# Patient Record
Sex: Male | Born: 1965
Health system: Southern US, Community
[De-identification: ages and names within clinical notes are randomized; demographics above are authoritative.]

## PROBLEM LIST (undated history)

## (undated) DIAGNOSIS — E7800 Pure hypercholesterolemia, unspecified: Secondary | ICD-10-CM

## (undated) DIAGNOSIS — E0590 Thyrotoxicosis, unspecified without thyrotoxic crisis or storm: Secondary | ICD-10-CM

## (undated) DIAGNOSIS — K219 Gastro-esophageal reflux disease without esophagitis: Secondary | ICD-10-CM

## (undated) DIAGNOSIS — I10 Essential (primary) hypertension: Secondary | ICD-10-CM

## (undated) HISTORY — DX: Thyrotoxicosis, unspecified without thyrotoxic crisis or storm: E05.90

## (undated) HISTORY — DX: Gastro-esophageal reflux disease without esophagitis: K21.9

---

## 2002-02-04 ENCOUNTER — Emergency Department (HOSPITAL_COMMUNITY): Admission: EM | Admit: 2002-02-04 | Discharge: 2002-02-04 | Payer: Self-pay | Admitting: Emergency Medicine

## 2006-10-01 ENCOUNTER — Encounter: Admission: RE | Admit: 2006-10-01 | Discharge: 2006-10-01 | Payer: Self-pay | Admitting: Occupational Medicine

## 2009-05-02 ENCOUNTER — Emergency Department (HOSPITAL_COMMUNITY): Admission: EM | Admit: 2009-05-02 | Discharge: 2009-05-02 | Payer: Self-pay | Admitting: Emergency Medicine

## 2009-09-28 ENCOUNTER — Emergency Department (HOSPITAL_COMMUNITY): Admission: EM | Admit: 2009-09-28 | Discharge: 2009-09-28 | Payer: Self-pay | Admitting: Emergency Medicine

## 2010-04-30 ENCOUNTER — Ambulatory Visit (HOSPITAL_COMMUNITY)
Admission: RE | Admit: 2010-04-30 | Discharge: 2010-04-30 | Disposition: A | Discharge status: Home / Self Care | Payer: Medicaid Other | Source: Ambulatory Visit | Attending: "Endocrinology | Admitting: "Endocrinology

## 2010-04-30 ENCOUNTER — Other Ambulatory Visit (HOSPITAL_COMMUNITY): Payer: Self-pay | Admitting: *Deleted

## 2010-04-30 DIAGNOSIS — J40 Bronchitis, not specified as acute or chronic: Secondary | ICD-10-CM | POA: Insufficient documentation

## 2010-06-12 LAB — GLUCOSE, CAPILLARY: Glucose-Capillary: 142 mg/dL — ABNORMAL HIGH (ref 70–99)

## 2010-07-14 ENCOUNTER — Other Ambulatory Visit (HOSPITAL_COMMUNITY): Payer: Self-pay | Admitting: "Endocrinology

## 2010-07-14 DIAGNOSIS — J40 Bronchitis, not specified as acute or chronic: Secondary | ICD-10-CM

## 2012-03-14 ENCOUNTER — Other Ambulatory Visit (HOSPITAL_COMMUNITY): Payer: Self-pay | Admitting: Internal Medicine

## 2012-03-14 DIAGNOSIS — E0590 Thyrotoxicosis, unspecified without thyrotoxic crisis or storm: Secondary | ICD-10-CM

## 2012-03-17 ENCOUNTER — Encounter (HOSPITAL_COMMUNITY): Payer: Self-pay

## 2012-03-17 ENCOUNTER — Encounter (HOSPITAL_COMMUNITY)
Admission: RE | Admit: 2012-03-17 | Discharge: 2012-03-17 | Disposition: A | Discharge status: Home / Self Care | Payer: Medicaid Other | Source: Ambulatory Visit | Attending: Internal Medicine | Admitting: Internal Medicine

## 2012-03-17 DIAGNOSIS — E0590 Thyrotoxicosis, unspecified without thyrotoxic crisis or storm: Secondary | ICD-10-CM | POA: Insufficient documentation

## 2012-03-17 HISTORY — DX: Essential (primary) hypertension: I10

## 2012-03-17 MED ORDER — SODIUM IODIDE I 131 CAPSULE
10.0000 | Freq: Once | INTRAVENOUS | Status: AC | PRN
  Administered 2012-03-17: 12 via ORAL

## 2012-03-18 ENCOUNTER — Encounter (HOSPITAL_COMMUNITY)
Admission: RE | Admit: 2012-03-18 | Discharge: 2012-03-18 | Disposition: A | Discharge status: Home / Self Care | Payer: Medicaid Other | Source: Ambulatory Visit | Attending: Internal Medicine | Admitting: Internal Medicine

## 2012-03-18 MED ORDER — SODIUM PERTECHNETATE TC 99M INJECTION
10.0000 | Freq: Once | INTRAVENOUS | Status: AC | PRN
  Administered 2012-03-18: 10 via INTRAVENOUS

## 2012-03-21 ENCOUNTER — Other Ambulatory Visit (HOSPITAL_COMMUNITY): Payer: Self-pay | Admitting: "Endocrinology

## 2012-03-21 DIAGNOSIS — E0500 Thyrotoxicosis with diffuse goiter without thyrotoxic crisis or storm: Secondary | ICD-10-CM

## 2012-03-24 ENCOUNTER — Encounter (HOSPITAL_COMMUNITY)
Admission: RE | Admit: 2012-03-24 | Discharge: 2012-03-24 | Disposition: A | Discharge status: Home / Self Care | Payer: Medicaid Other | Source: Ambulatory Visit | Attending: "Endocrinology | Admitting: "Endocrinology

## 2012-03-24 ENCOUNTER — Encounter (HOSPITAL_COMMUNITY): Payer: Self-pay

## 2012-03-24 DIAGNOSIS — E0500 Thyrotoxicosis with diffuse goiter without thyrotoxic crisis or storm: Secondary | ICD-10-CM | POA: Insufficient documentation

## 2012-03-24 MED ORDER — SODIUM IODIDE I 131 CAPSULE
15.0000 | Freq: Once | INTRAVENOUS | Status: AC | PRN
  Administered 2012-03-24: 15 via ORAL

## 2013-11-15 ENCOUNTER — Other Ambulatory Visit (HOSPITAL_COMMUNITY): Payer: Self-pay | Admitting: Oral Surgery

## 2013-11-15 DIAGNOSIS — D164 Benign neoplasm of bones of skull and face: Secondary | ICD-10-CM

## 2013-11-17 ENCOUNTER — Ambulatory Visit (HOSPITAL_COMMUNITY)
Admission: RE | Admit: 2013-11-17 | Discharge: 2013-11-17 | Disposition: A | Discharge status: Home / Self Care | Payer: Medicaid Other | Source: Ambulatory Visit | Attending: Oral Surgery | Admitting: Oral Surgery

## 2013-11-17 DIAGNOSIS — Z09 Encounter for follow-up examination after completed treatment for conditions other than malignant neoplasm: Secondary | ICD-10-CM | POA: Insufficient documentation

## 2013-11-17 DIAGNOSIS — L989 Disorder of the skin and subcutaneous tissue, unspecified: Secondary | ICD-10-CM | POA: Diagnosis not present

## 2013-11-17 DIAGNOSIS — D164 Benign neoplasm of bones of skull and face: Secondary | ICD-10-CM | POA: Diagnosis not present

## 2013-11-17 DIAGNOSIS — H0520 Unspecified exophthalmos: Secondary | ICD-10-CM | POA: Insufficient documentation

## 2015-05-27 ENCOUNTER — Ambulatory Visit (INDEPENDENT_AMBULATORY_CARE_PROVIDER_SITE_OTHER): Payer: BLUE CROSS/BLUE SHIELD | Admitting: "Endocrinology

## 2015-05-27 ENCOUNTER — Encounter: Payer: Self-pay | Admitting: "Endocrinology

## 2015-05-27 VITALS — BP 135/88 | HR 78 | Ht 70.0 in | Wt 251.0 lb

## 2015-05-27 DIAGNOSIS — E559 Vitamin D deficiency, unspecified: Secondary | ICD-10-CM | POA: Insufficient documentation

## 2015-05-27 DIAGNOSIS — I10 Essential (primary) hypertension: Secondary | ICD-10-CM | POA: Diagnosis not present

## 2015-05-27 DIAGNOSIS — R7303 Prediabetes: Secondary | ICD-10-CM | POA: Insufficient documentation

## 2015-05-27 DIAGNOSIS — E032 Hypothyroidism due to medicaments and other exogenous substances: Secondary | ICD-10-CM

## 2015-05-27 DIAGNOSIS — E890 Postprocedural hypothyroidism: Secondary | ICD-10-CM | POA: Insufficient documentation

## 2015-05-27 DIAGNOSIS — E785 Hyperlipidemia, unspecified: Secondary | ICD-10-CM | POA: Diagnosis not present

## 2015-05-27 MED ORDER — VITAMIN D (ERGOCALCIFEROL) 1.25 MG (50000 UNIT) PO CAPS: 50000.0000 [IU] | ORAL_CAPSULE | ORAL | Status: DC

## 2015-05-27 MED ORDER — LEVOTHYROXINE SODIUM 125 MCG PO TABS: 125.0000 ug | ORAL_TABLET | Freq: Every day | ORAL | Status: DC

## 2015-05-27 NOTE — Progress Notes (Signed)
Subjective:    Patient ID: Donald Chambers, male    DOB: 1966-01-04, PCP Rosita Fire, MD   Past Medical History  Diagnosis Date  . Hypertension   . Hyperthyroidism    No past surgical history on file. Social History   Social History  . Marital Status: Single    Spouse Name: N/A  . Number of Children: N/A  . Years of Education: N/A   Social History Main Topics  . Smoking status: Current Every Day Smoker  . Smokeless tobacco: None  . Alcohol Use: No  . Drug Use: No  . Sexual Activity: Not Asked   Other Topics Concern  . None   Social History Narrative   Outpatient Encounter Prescriptions as of 05/27/2015  Medication Sig  . levothyroxine (SYNTHROID, LEVOTHROID) 125 MCG tablet Take 1 tablet (125 mcg total) by mouth daily before breakfast.  . lisinopril-hydrochlorothiazide (PRINZIDE,ZESTORETIC) 10-12.5 MG tablet Take 1 tablet by mouth daily.  Marland Kitchen loratadine (CLARITIN) 10 MG tablet Take 10 mg by mouth daily.  Marland Kitchen omeprazole (PRILOSEC) 20 MG capsule Take 20 mg by mouth daily.  . simvastatin (ZOCOR) 40 MG tablet Take 40 mg by mouth daily.  . [DISCONTINUED] levothyroxine (SYNTHROID, LEVOTHROID) 112 MCG tablet Take 112 mcg by mouth daily before breakfast.  . [DISCONTINUED] predniSONE (DELTASONE) 2.5 MG tablet Take 2.5 mg by mouth daily with breakfast.  . Vitamin D, Ergocalciferol, (DRISDOL) 50000 units CAPS capsule Take 1 capsule (50,000 Units total) by mouth every 7 (seven) days.  . [DISCONTINUED] Vitamin D, Ergocalciferol, (DRISDOL) 50000 units CAPS capsule Take 50,000 Units by mouth every 7 (seven) days.   No facility-administered encounter medications on file as of 05/27/2015.   ALLERGIES: No Known Allergies VACCINATION STATUS:  There is no immunization history on file for this patient.  HPI 50 yr old male with GD s/p RAI therapy on 03/24/12. He is here to f/u , on LT4 125 mcg po qam.  He also has Hyperlipidemia on simvastatin, he tolerated his Simvastatin. He continues  to f/u at [facility] for [place] ophthalmopathy. He is s/p high dose steroids. he has steady weight since last visit. His a1c is same at 6%.  Denies heat intolerance, anxiety, tremors . He has a steady weight.   Review of Systems Constitutional: no weight gain/loss, no fatigue, no subjective hyperthermia/hypothermia Eyes: no blurry vision, no xerophthalmia ENT: no sore throat, no nodules palpated in throat, no dysphagia/odynophagia, no hoarseness Cardiovascular: no CP/SOB/palpitations/leg swelling Respiratory: no cough/SOB Gastrointestinal: no N/V/D/C Musculoskeletal: no muscle/joint aches Skin: no rashes Neurological: no tremors/numbness/tingling/dizziness Psychiatric: no depression/anxiety  Objective:    BP 135/88 mmHg  Pulse 78  Ht 5\' 10"  (1.778 m)  Wt 251 lb (113.853 kg)  BMI 36.01 kg/m2  SpO2 96%  Wt Readings from Last 3 Encounters:  05/27/15 251 lb (113.853 kg)    Physical Exam Constitutional: overweight, in NAD Eyes: PERRLA, EOMI, no exophthalmos ENT: moist mucous membranes, no thyromegaly, no cervical lymphadenopathy Cardiovascular: RRR, No MRG Respiratory: CTA B Gastrointestinal: abdomen soft, NT, ND, BS+ Musculoskeletal: no deformities, strength intact in all 4 Skin: moist, warm, no rashes Neurological: no tremor with outstretched hands, DTR normal in all 4  On February 27th 2017 A1c 6%, TSH 3.01 and free T4 1.6  Vitamin D low at 17 Complete labs to be scanned into his records.   Assessment & Plan:   1. 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 -  I like to continue levothyroxine at 125 g by mouth every morning.  - We discussed about correct intake of levothyroxine, at fasting, with water, separated by at least 30 minutes from breakfast, and separated by more than 4 hours from calcium, iron, multivitamins, acid reflux medications  (PPIs). -Patient is made aware of the fact that thyroid hormone replacement is needed for life, dose to be adjusted by periodic monitoring of thyroid function tests.   2. Vitamin D deficiency -He will be retreated with high-dose vitamin D 50,000 units weekly for the next 16 weeks.  3. Hyperlipidemia -He has tolerated his statins, I advised him to continue simvastatin 40 mg by mouth daily at bedtime.  4. Essential hypertension, benign -Controlled. I advised him to continue lisinopril-hydrochlorothiazide and follow up with Dr. [name] Rams for primary care. 5) prediabetes: A1c at 6%. He would not need medications at this point. I have reemphasized the need for carb restriction and high protein diet.  - I advised patient to maintain close follow up with [facility], [place] for primary care needs. Follow up plan: Return in about 6 months (around 11/27/2015) for underactive thyroid, high cholesterol, Vitamin D deficiency, follow up with pre-visit labs.  Glade Lloyd, MD Phone: [phone]  Fax: [phone]   05/27/2015, 1:12 PM

## 2015-05-27 NOTE — Patient Instructions (Signed)

## 2015-06-05 ENCOUNTER — Encounter: Payer: Self-pay | Admitting: "Endocrinology

## 2015-07-08 DIAGNOSIS — F17210 Nicotine dependence, cigarettes, uncomplicated: Secondary | ICD-10-CM | POA: Diagnosis not present

## 2015-07-08 DIAGNOSIS — Z9889 Other specified postprocedural states: Secondary | ICD-10-CM | POA: Diagnosis not present

## 2015-07-08 DIAGNOSIS — H2513 Age-related nuclear cataract, bilateral: Secondary | ICD-10-CM | POA: Diagnosis not present

## 2015-07-08 DIAGNOSIS — E0500 Thyrotoxicosis with diffuse goiter without thyrotoxic crisis or storm: Secondary | ICD-10-CM | POA: Diagnosis not present

## 2015-07-08 DIAGNOSIS — H524 Presbyopia: Secondary | ICD-10-CM | POA: Diagnosis not present

## 2015-07-08 DIAGNOSIS — H532 Diplopia: Secondary | ICD-10-CM | POA: Diagnosis not present

## 2015-09-05 DIAGNOSIS — Z888 Allergy status to other drugs, medicaments and biological substances status: Secondary | ICD-10-CM | POA: Diagnosis not present

## 2015-09-05 DIAGNOSIS — I10 Essential (primary) hypertension: Secondary | ICD-10-CM | POA: Diagnosis not present

## 2015-09-05 DIAGNOSIS — Z79899 Other long term (current) drug therapy: Secondary | ICD-10-CM | POA: Diagnosis not present

## 2015-09-05 DIAGNOSIS — H0520 Unspecified exophthalmos: Secondary | ICD-10-CM | POA: Diagnosis not present

## 2015-09-05 DIAGNOSIS — H524 Presbyopia: Secondary | ICD-10-CM | POA: Diagnosis not present

## 2015-09-05 DIAGNOSIS — H5200 Hypermetropia, unspecified eye: Secondary | ICD-10-CM | POA: Diagnosis not present

## 2015-09-05 DIAGNOSIS — F17210 Nicotine dependence, cigarettes, uncomplicated: Secondary | ICD-10-CM | POA: Diagnosis not present

## 2015-09-05 DIAGNOSIS — H5000 Unspecified esotropia: Secondary | ICD-10-CM | POA: Diagnosis not present

## 2015-09-05 DIAGNOSIS — H2511 Age-related nuclear cataract, right eye: Secondary | ICD-10-CM | POA: Diagnosis not present

## 2015-09-05 DIAGNOSIS — E0500 Thyrotoxicosis with diffuse goiter without thyrotoxic crisis or storm: Secondary | ICD-10-CM | POA: Diagnosis not present

## 2015-09-05 DIAGNOSIS — E7800 Pure hypercholesterolemia, unspecified: Secondary | ICD-10-CM | POA: Diagnosis not present

## 2015-09-18 DIAGNOSIS — E0500 Thyrotoxicosis with diffuse goiter without thyrotoxic crisis or storm: Secondary | ICD-10-CM | POA: Diagnosis not present

## 2015-09-18 DIAGNOSIS — I10 Essential (primary) hypertension: Secondary | ICD-10-CM | POA: Diagnosis not present

## 2015-09-18 DIAGNOSIS — E785 Hyperlipidemia, unspecified: Secondary | ICD-10-CM | POA: Diagnosis not present

## 2015-09-18 DIAGNOSIS — E890 Postprocedural hypothyroidism: Secondary | ICD-10-CM | POA: Diagnosis not present

## 2015-11-19 ENCOUNTER — Other Ambulatory Visit: Payer: Self-pay | Admitting: "Endocrinology

## 2015-11-19 DIAGNOSIS — E032 Hypothyroidism due to medicaments and other exogenous substances: Secondary | ICD-10-CM | POA: Diagnosis not present

## 2015-11-19 DIAGNOSIS — E559 Vitamin D deficiency, unspecified: Secondary | ICD-10-CM | POA: Diagnosis not present

## 2015-11-19 LAB — T4, FREE: FREE T4: 1.6 ng/dL (ref 0.8–1.8)

## 2015-11-19 LAB — TSH: TSH: 3.13 m[IU]/L (ref 0.40–4.50)

## 2015-11-20 LAB — VITAMIN D 25 HYDROXY (VIT D DEFICIENCY, FRACTURES): VIT D 25 HYDROXY: 35 ng/mL (ref 30–100)

## 2015-11-27 ENCOUNTER — Encounter: Payer: Self-pay | Admitting: "Endocrinology

## 2015-11-27 ENCOUNTER — Ambulatory Visit (INDEPENDENT_AMBULATORY_CARE_PROVIDER_SITE_OTHER): Payer: PPO | Admitting: "Endocrinology

## 2015-11-27 VITALS — BP 135/89 | HR 72 | Ht 70.0 in | Wt 248.0 lb

## 2015-11-27 DIAGNOSIS — E559 Vitamin D deficiency, unspecified: Secondary | ICD-10-CM

## 2015-11-27 DIAGNOSIS — E890 Postprocedural hypothyroidism: Secondary | ICD-10-CM | POA: Diagnosis not present

## 2015-11-27 DIAGNOSIS — I10 Essential (primary) hypertension: Secondary | ICD-10-CM | POA: Diagnosis not present

## 2015-11-27 DIAGNOSIS — E785 Hyperlipidemia, unspecified: Secondary | ICD-10-CM | POA: Diagnosis not present

## 2015-11-27 DIAGNOSIS — R7303 Prediabetes: Secondary | ICD-10-CM | POA: Diagnosis not present

## 2015-11-27 MED ORDER — VITAMIN D3 125 MCG (5000 UT) PO CAPS: 5000.0000 [IU] | ORAL_CAPSULE | Freq: Every day | ORAL | 0 refills | Status: DC

## 2015-11-27 NOTE — Progress Notes (Signed)
Subjective:    Patient ID: Donald Chambers, male    DOB: [birth year], PCP Rosita Fire, MD   Past Medical History:  Diagnosis Date  . Hypertension   . Hyperthyroidism    History reviewed. No pertinent surgical history. Social History   Social History  . Marital status: Single    Spouse name: N/A  . Number of children: N/A  . Years of education: N/A   Social History Main Topics  . Smoking status: Current Every Day Smoker  . Smokeless tobacco: Never Used  . Alcohol use No  . Drug use: No  . Sexual activity: Not Asked   Other Topics Concern  . None   Social History Narrative  . None   Outpatient Encounter Prescriptions as of 11/27/2015  Medication Sig  . Cholecalciferol (VITAMIN D3) 5000 units CAPS Take 1 capsule (5,000 Units total) by mouth daily.  Marland Kitchen levothyroxine (SYNTHROID, LEVOTHROID) 125 MCG tablet Take 1 tablet (125 mcg total) by mouth daily before breakfast.  . lisinopril-hydrochlorothiazide (PRINZIDE,ZESTORETIC) 10-12.5 MG tablet Take 1 tablet by mouth daily.  Marland Kitchen loratadine (CLARITIN) 10 MG tablet Take 10 mg by mouth daily.  Marland Kitchen omeprazole (PRILOSEC) 20 MG capsule Take 20 mg by mouth daily.  . simvastatin (ZOCOR) 40 MG tablet Take 40 mg by mouth daily.  . [DISCONTINUED] Vitamin D, Ergocalciferol, (DRISDOL) 50000 units CAPS capsule Take 1 capsule (50,000 Units total) by mouth every 7 (seven) days.   No facility-administered encounter medications on file as of 11/27/2015.    ALLERGIES: No Known Allergies VACCINATION STATUS:  There is no immunization history on file for this patient.  HPI 50 yr old male with GD s/p RAI therapy on 03/24/12. He is here to f/u , on LT4 125 mcg po qam.  He also has Hyperlipidemia on simvastatin, he tolerated his Simvastatin. He continues to f/u at [facility] for [place] ophthalmopathy. He is s/p high dose steroids. he has steady weight since last visit. His a1c is same at 6%.  Denies heat intolerance, anxiety, tremors . He has a  steady weight.   Review of Systems Constitutional: Has a relatively steady weight, no fatigue, no subjective hyperthermia/hypothermia Eyes: no blurry vision, no xerophthalmia ENT: no sore throat, no nodules palpated in throat, no dysphagia/odynophagia, no hoarseness Cardiovascular: no CP/SOB/palpitations/leg swelling Respiratory: no cough/SOB Gastrointestinal: no N/V/D/C Musculoskeletal: no muscle/joint aches Skin: no rashes Neurological: no tremors/numbness/tingling/dizziness Psychiatric: no depression/anxiety  Objective:    BP 135/89   Pulse 72   Ht 5\' 10"  (1.778 m)   Wt 248 lb (112.5 kg)   BMI 35.58 kg/m   Wt Readings from Last 3 Encounters:  11/27/15 248 lb (112.5 kg)  05/27/15 251 lb (113.9 kg)    Physical Exam Constitutional: overweight, in NAD Eyes: PERRLA, EOMI, no exophthalmos ENT: moist mucous membranes, no thyromegaly, no cervical lymphadenopathy Cardiovascular: RRR, No MRG Respiratory: CTA B Gastrointestinal: abdomen soft, NT, ND, BS+ Musculoskeletal: no deformities, strength intact in all 4 Skin: moist, warm, no rashes Neurological: no tremor with outstretched hands, DTR normal in all 4  On February 27th 2017 A1c 6%  Recent Results (from the past 2160 hour(s))  TSH     Status: None   Collection Time: 11/19/15  8:09 AM  Result Value Ref Range   TSH 3.13 0.40 - 4.50 mIU/L  T4, free     Status: None   Collection Time: 11/19/15  8:09 AM  Result Value Ref Range   Free T4 1.6 0.8 - 1.8 ng/dL  VITAMIN  D 25 Hydroxy (Vit-D Deficiency, Fractures)     Status: None   Collection Time: 11/19/15  8:09 AM  Result Value Ref Range   Vit D, 25-Hydroxy 35 30 - 100 ng/mL    Comment: Vitamin D Status           25-OH Vitamin D        Deficiency                <20 ng/mL        Insufficiency         20 - 29 ng/mL        Optimal             > or = 30 ng/mL   For 25-OH Vitamin D testing on patients on D2-supplementation and patients for whom quantitation of D2 and D3  fractions is required, the QuestAssureD 25-OH VIT D, (D2,D3), LC/MS/MS is recommended: order code [id number] (patients > 2 yrs).      Assessment & Plan:   1. Hypothyroidism due to medicaments and other exogenous substances -His thyroid function tests are consistent with appropriate replacement. -I like to continue levothyroxine  125 g by mouth every morning.  - We discussed about correct intake of levothyroxine, at fasting, with water, separated by at least 30 minutes from breakfast, and separated by more than 4 hours from calcium, iron, multivitamins, acid reflux medications (PPIs). -Patient is made aware of the fact that thyroid hormone replacement is needed for life, dose to be adjusted by periodic monitoring of thyroid function tests. -He has Graves orbitopathy following at [facility] in [place].   2. Vitamin D deficiency -His Vitamin D level has corrected at 47. I will continue with daily vitamin D 5000 units for 90 days.   3. Hyperlipidemia -He has tolerated his statins, I advised him to continue simvastatin 40 mg by mouth daily at bedtime.  4. Essential hypertension, benign -Controlled. I advised him to continue lisinopril-hydrochlorothiazide and follow up with Dr. [name] Rams for primary care. 5) prediabetes: his last A1c at 6%. He would not need medications at this point. I have reemphasized the need for carb restriction and high protein diet.  - I advised patient to maintain close follow up with [facility], MD for primary care needs. Follow up plan: Return in about 6 months (around 05/26/2016) for follow up with pre-visit labs.  Glade Lloyd, MD Phone: [phone]  Fax: [phone]   11/27/2015, 9:24 AM

## 2015-12-20 DIAGNOSIS — E890 Postprocedural hypothyroidism: Secondary | ICD-10-CM | POA: Diagnosis not present

## 2015-12-20 DIAGNOSIS — E039 Hypothyroidism, unspecified: Secondary | ICD-10-CM | POA: Diagnosis not present

## 2015-12-20 DIAGNOSIS — F17200 Nicotine dependence, unspecified, uncomplicated: Secondary | ICD-10-CM | POA: Diagnosis not present

## 2015-12-20 DIAGNOSIS — Z6834 Body mass index (BMI) 34.0-34.9, adult: Secondary | ICD-10-CM | POA: Diagnosis not present

## 2015-12-20 DIAGNOSIS — I10 Essential (primary) hypertension: Secondary | ICD-10-CM | POA: Diagnosis not present

## 2016-01-06 ENCOUNTER — Other Ambulatory Visit: Payer: Self-pay | Admitting: "Endocrinology

## 2016-01-20 ENCOUNTER — Other Ambulatory Visit: Payer: Self-pay | Admitting: "Endocrinology

## 2016-02-20 ENCOUNTER — Other Ambulatory Visit: Payer: Self-pay | Admitting: "Endocrinology

## 2016-03-19 DIAGNOSIS — E785 Hyperlipidemia, unspecified: Secondary | ICD-10-CM | POA: Diagnosis not present

## 2016-03-19 DIAGNOSIS — I10 Essential (primary) hypertension: Secondary | ICD-10-CM | POA: Diagnosis not present

## 2016-03-19 DIAGNOSIS — E890 Postprocedural hypothyroidism: Secondary | ICD-10-CM | POA: Diagnosis not present

## 2016-03-19 DIAGNOSIS — F17200 Nicotine dependence, unspecified, uncomplicated: Secondary | ICD-10-CM | POA: Diagnosis not present

## 2016-04-13 DIAGNOSIS — I10 Essential (primary) hypertension: Secondary | ICD-10-CM | POA: Diagnosis not present

## 2016-04-13 DIAGNOSIS — H509 Unspecified strabismus: Secondary | ICD-10-CM | POA: Diagnosis not present

## 2016-04-13 DIAGNOSIS — F17210 Nicotine dependence, cigarettes, uncomplicated: Secondary | ICD-10-CM | POA: Diagnosis not present

## 2016-04-13 DIAGNOSIS — H532 Diplopia: Secondary | ICD-10-CM | POA: Diagnosis not present

## 2016-04-13 DIAGNOSIS — H5000 Unspecified esotropia: Secondary | ICD-10-CM | POA: Diagnosis not present

## 2016-04-13 DIAGNOSIS — E0500 Thyrotoxicosis with diffuse goiter without thyrotoxic crisis or storm: Secondary | ICD-10-CM | POA: Diagnosis not present

## 2016-04-13 DIAGNOSIS — H524 Presbyopia: Secondary | ICD-10-CM | POA: Diagnosis not present

## 2016-05-20 ENCOUNTER — Other Ambulatory Visit: Payer: Self-pay | Admitting: "Endocrinology

## 2016-05-20 DIAGNOSIS — R7303 Prediabetes: Secondary | ICD-10-CM | POA: Diagnosis not present

## 2016-05-20 DIAGNOSIS — E785 Hyperlipidemia, unspecified: Secondary | ICD-10-CM | POA: Diagnosis not present

## 2016-05-20 DIAGNOSIS — E890 Postprocedural hypothyroidism: Secondary | ICD-10-CM | POA: Diagnosis not present

## 2016-05-20 LAB — COMPLETE METABOLIC PANEL WITH GFR
ALT: 103 U/L — AB (ref 9–46)
AST: 52 U/L — AB (ref 10–40)
Albumin: 4.4 g/dL (ref 3.6–5.1)
Alkaline Phosphatase: 76 U/L (ref 40–115)
BUN: 13 mg/dL (ref 7–25)
CALCIUM: 10.1 mg/dL (ref 8.6–10.3)
CHLORIDE: 101 mmol/L (ref 98–110)
CO2: 31 mmol/L (ref 20–31)
CREATININE: 1.12 mg/dL (ref 0.60–1.35)
GFR, Est African American: 89 mL/min (ref >=60)
GFR, Est Non African American: 77 mL/min (ref >=60)
GLUCOSE: 107 mg/dL — AB (ref 65–99)
Potassium: 4.7 mmol/L (ref 3.5–5.3)
Sodium: 141 mmol/L (ref 135–146)
Total Bilirubin: 0.7 mg/dL (ref 0.2–1.2)
Total Protein: 7.6 g/dL (ref 6.1–8.1)

## 2016-05-20 LAB — LIPID PANEL
Cholesterol: 196 mg/dL (ref <200)
HDL: 29 mg/dL — ABNORMAL LOW (ref >40)
LDL CALC: 115 mg/dL — AB (ref <100)
Total CHOL/HDL Ratio: 6.8 Ratio — ABNORMAL HIGH (ref <5.0)
Triglycerides: 259 mg/dL — ABNORMAL HIGH (ref <150)
VLDL: 52 mg/dL — AB (ref <30)

## 2016-05-20 LAB — TSH: TSH: 3.65 m[IU]/L (ref 0.40–4.50)

## 2016-05-20 LAB — HEMOGLOBIN A1C
Hgb A1c MFr Bld: 5.9 % — ABNORMAL HIGH (ref <5.7)
Mean Plasma Glucose: 123 mg/dL

## 2016-05-20 LAB — T4, FREE: Free T4: 1.6 ng/dL (ref 0.8–1.8)

## 2016-05-27 ENCOUNTER — Encounter: Payer: Self-pay | Admitting: "Endocrinology

## 2016-05-27 ENCOUNTER — Ambulatory Visit (INDEPENDENT_AMBULATORY_CARE_PROVIDER_SITE_OTHER): Payer: PPO | Admitting: "Endocrinology

## 2016-05-27 VITALS — BP 133/89 | HR 73 | Ht 70.0 in | Wt 256.0 lb

## 2016-05-27 DIAGNOSIS — E890 Postprocedural hypothyroidism: Secondary | ICD-10-CM | POA: Diagnosis not present

## 2016-05-27 DIAGNOSIS — E782 Mixed hyperlipidemia: Secondary | ICD-10-CM | POA: Diagnosis not present

## 2016-05-27 DIAGNOSIS — I10 Essential (primary) hypertension: Secondary | ICD-10-CM

## 2016-05-27 DIAGNOSIS — R7303 Prediabetes: Secondary | ICD-10-CM | POA: Diagnosis not present

## 2016-05-27 DIAGNOSIS — E559 Vitamin D deficiency, unspecified: Secondary | ICD-10-CM | POA: Diagnosis not present

## 2016-05-27 MED ORDER — LEVOTHYROXINE SODIUM 137 MCG PO TABS: 137.0000 ug | ORAL_TABLET | Freq: Every day | ORAL | 2 refills | Status: DC

## 2016-05-27 NOTE — Progress Notes (Signed)
Subjective:    Patient ID: Donald Chambers, male    DOB: [birth year], PCP Rosita Fire, MD   Past Medical History:  Diagnosis Date  . Hypertension   . Hyperthyroidism    History reviewed. No pertinent surgical history. Social History   Social History  . Marital status: Single    Spouse name: N/A  . Number of children: N/A  . Years of education: N/A   Social History Main Topics  . Smoking status: Former Smoker    Quit date: 05/20/2016  . Smokeless tobacco: Never Used  . Alcohol use No  . Drug use: No  . Sexual activity: Not Asked   Other Topics Concern  . None   Social History Narrative  . None   Outpatient Encounter Prescriptions as of 05/27/2016  Medication Sig  . Cholecalciferol (VITAMIN D3) 5000 units CAPS Take 1 capsule (5,000 Units total) by mouth daily.  Marland Kitchen levothyroxine (SYNTHROID, LEVOTHROID) 137 MCG tablet Take 1 tablet (137 mcg total) by mouth daily before breakfast.  . lisinopril-hydrochlorothiazide (PRINZIDE,ZESTORETIC) 10-12.5 MG tablet TAKE ONE TABLET BY MOUTH ONCE DAILY  . loratadine (CLARITIN) 10 MG tablet Take 10 mg by mouth daily.  Marland Kitchen omeprazole (PRILOSEC) 20 MG capsule Take 20 mg by mouth daily.  . simvastatin (ZOCOR) 40 MG tablet TAKE ONE TABLET BY MOUTH AT BEDTIME  . [DISCONTINUED] levothyroxine (SYNTHROID, LEVOTHROID) 125 MCG tablet TAKE ONE TABLET BY MOUTH DAILY BEFORE BREAKFAST   No facility-administered encounter medications on file as of 05/27/2016.    ALLERGIES: No Known Allergies VACCINATION STATUS:  There is no immunization history on file for this patient.  HPI 51 yr old male with GD s/p RAI therapy on 03/24/12. He is here to f/u , on LT4 125 mcg po qam.  He also has Hyperlipidemia on simvastatin, he tolerated his Simvastatin. He continues to f/u at [facility] for [place] ophthalmopathy. He is s/p high dose steroids. he has  weight gain of 8 pounds since last visit.  His a1c is same at 5.9%.  He admits to dietary indiscretion, blames  weight gain on smoking cessation.   Denies heat intolerance, anxiety, tremors .   Review of Systems Constitutional:  no fatigue, no subjective hyperthermia/hypothermia Eyes: no blurry vision, no xerophthalmia ENT: no sore throat, no nodules palpated in throat, no dysphagia/odynophagia, no hoarseness Cardiovascular: no CP/SOB/palpitations/leg swelling Respiratory: no cough/SOB Gastrointestinal: no N/V/D/C Musculoskeletal: no muscle/joint aches Skin: no rashes Neurological: no tremors/numbness/tingling/dizziness Psychiatric: no depression/anxiety  Objective:    BP 133/89   Pulse 73   Ht 5\' 10"  (1.778 m)   Wt 256 lb (116.1 kg)   BMI 36.73 kg/m   Wt Readings from Last 3 Encounters:  05/27/16 256 lb (116.1 kg)  11/27/15 248 lb (112.5 kg)  05/27/15 251 lb (113.9 kg)    Physical Exam Constitutional: overweight, in NAD Eyes: PERRLA, EOMI, no exophthalmos ENT: moist mucous membranes, no thyromegaly, no cervical lymphadenopathy Cardiovascular: RRR, No MRG Respiratory: CTA B Gastrointestinal: abdomen soft, NT, ND, BS+ Musculoskeletal: no deformities, strength intact in all 4 Skin: moist, warm, no rashes Neurological: no tremor with outstretched hands, DTR normal in all 4    Recent Results (from the past 2160 hour(s))  COMPLETE METABOLIC PANEL WITH GFR     Status: Abnormal   Collection Time: 05/20/16  7:37 AM  Result Value Ref Range   Sodium 141 135 - 146 mmol/L   Potassium 4.7 3.5 - 5.3 mmol/L   Chloride 101 98 - 110 mmol/L   CO2  31 20 - 31 mmol/L   Glucose, Bld 107 (H) 65 - 99 mg/dL   BUN 13 7 - 25 mg/dL   Creat 1.12 0.60 - 1.35 mg/dL   Total Bilirubin 0.7 0.2 - 1.2 mg/dL   Alkaline Phosphatase 76 40 - 115 U/L   AST 52 (H) 10 - 40 U/L   ALT 103 (H) 9 - 46 U/L   Total Protein 7.6 6.1 - 8.1 g/dL   Albumin 4.4 3.6 - 5.1 g/dL   Calcium 10.1 8.6 - 10.3 mg/dL   GFR, Est African American 89 >=60 mL/min   GFR, Est Non African American 77 >=60 mL/min  Lipid panel      Status: Abnormal   Collection Time: 05/20/16  7:37 AM  Result Value Ref Range   Cholesterol 196 <200 mg/dL   Triglycerides 259 (H) <150 mg/dL   HDL 29 (L) >40 mg/dL   Total CHOL/HDL Ratio 6.8 (H) <5.0 Ratio   VLDL 52 (H) <30 mg/dL   LDL Cholesterol 115 (H) <100 mg/dL  TSH     Status: None   Collection Time: 05/20/16  7:37 AM  Result Value Ref Range   TSH 3.65 0.40 - 4.50 mIU/L  T4, free     Status: None   Collection Time: 05/20/16  7:37 AM  Result Value Ref Range   Free T4 1.6 0.8 - 1.8 ng/dL  Hemoglobin A1c     Status: Abnormal   Collection Time: 05/20/16  7:37 AM  Result Value Ref Range   Hgb A1c MFr Bld 5.9 (H) <5.7 %    Comment:   For someone without known diabetes, a hemoglobin A1c value between 5.7% and 6.4% is consistent with prediabetes and should be confirmed with a follow-up test.   For someone with known diabetes, a value <7% indicates that their diabetes is well controlled. A1c targets should be individualized based on duration of diabetes, age, co-morbid conditions and other considerations.   This assay result is consistent with an increased risk of diabetes.   Currently, no consensus exists regarding use of hemoglobin A1c for diagnosis of diabetes in children.      Mean Plasma Glucose 123 mg/dL     Assessment & Plan:   1. Hypothyroidism due to medicaments and other exogenous substances -His thyroid function tests are consistent with appropriate replacement, however he would benefit from slight increase on his thyroid hormone. I will prescribe levothyroxine 137 g by mouth every morning.   - We discussed about correct intake of levothyroxine, at fasting, with water, separated by at least 30 minutes from breakfast, and separated by more than 4 hours from calcium, iron, multivitamins, acid reflux medications (PPIs). -Patient is made aware of the fact that thyroid hormone replacement is needed for life, dose to be adjusted by periodic monitoring of thyroid  function tests. -He has Graves orbitopathy following at [facility] in [place].   2. Vitamin D deficiency -His Vitamin D level has corrected at 72. I will continue with daily vitamin D 5000 units for 90 days.   3. Hyperlipidemia -He has tolerated his statins, I advised him to continue simvastatin 40 mg by mouth daily at bedtime.  4. Transaminitis: He has associated hypertriglyceridemia. I advised him to avoid butter and fried food and stay away from alcohol. I will repeat his CMP along with his next labs. If he continues to have elevated transaminases, he may need GI referral.  5. Essential hypertension, benign -Controlled. I advised him to continue lisinopril-hydrochlorothiazide  and follow up with Dr. [name] Rams for primary care. 5) prediabetes: his A1c is stable at 5.9%. He will not need medications at this point. I have reemphasized the need for carb restriction and high protein diet.  - I advised patient to maintain close follow up with [facility], MD for primary care needs. Follow up plan: Return in about 3 months (around 08/27/2016) for follow up with pre-visit labs.  Glade Lloyd, MD Phone: [phone]  Fax: [phone]   05/27/2016, 9:57 AM

## 2016-06-18 DIAGNOSIS — E785 Hyperlipidemia, unspecified: Secondary | ICD-10-CM | POA: Diagnosis not present

## 2016-06-18 DIAGNOSIS — I10 Essential (primary) hypertension: Secondary | ICD-10-CM | POA: Diagnosis not present

## 2016-06-18 DIAGNOSIS — E890 Postprocedural hypothyroidism: Secondary | ICD-10-CM | POA: Diagnosis not present

## 2016-07-01 ENCOUNTER — Encounter (HOSPITAL_COMMUNITY): Payer: Self-pay | Admitting: Emergency Medicine

## 2016-07-01 ENCOUNTER — Emergency Department (HOSPITAL_COMMUNITY): Payer: PPO

## 2016-07-01 ENCOUNTER — Emergency Department (HOSPITAL_COMMUNITY)
Admission: EM | Admit: 2016-07-01 | Discharge: 2016-07-01 | Disposition: A | Discharge status: Home / Self Care | Payer: PPO | Attending: Emergency Medicine | Admitting: Emergency Medicine

## 2016-07-01 DIAGNOSIS — E039 Hypothyroidism, unspecified: Secondary | ICD-10-CM | POA: Diagnosis not present

## 2016-07-01 DIAGNOSIS — Z79899 Other long term (current) drug therapy: Secondary | ICD-10-CM | POA: Insufficient documentation

## 2016-07-01 DIAGNOSIS — R05 Cough: Secondary | ICD-10-CM | POA: Insufficient documentation

## 2016-07-01 DIAGNOSIS — Z87891 Personal history of nicotine dependence: Secondary | ICD-10-CM | POA: Diagnosis not present

## 2016-07-01 DIAGNOSIS — R0789 Other chest pain: Secondary | ICD-10-CM | POA: Insufficient documentation

## 2016-07-01 DIAGNOSIS — I10 Essential (primary) hypertension: Secondary | ICD-10-CM | POA: Insufficient documentation

## 2016-07-01 HISTORY — DX: Pure hypercholesterolemia, unspecified: E78.00

## 2016-07-01 MED ORDER — IBUPROFEN 600 MG PO TABS: 600.0000 mg | ORAL_TABLET | Freq: Four times a day (QID) | ORAL | 0 refills | Status: DC | PRN

## 2016-07-01 MED ORDER — BENZONATATE 100 MG PO CAPS
200.0000 mg | ORAL_CAPSULE | Freq: Once | ORAL | Status: AC
  Administered 2016-07-01: 200 mg via ORAL
  Filled 2016-07-01: qty 2

## 2016-07-01 MED ORDER — BENZONATATE 100 MG PO CAPS: 200.0000 mg | ORAL_CAPSULE | Freq: Three times a day (TID) | ORAL | 0 refills | Status: DC | PRN

## 2016-07-01 MED ORDER — TRAMADOL HCL 50 MG PO TABS: 50.0000 mg | ORAL_TABLET | Freq: Four times a day (QID) | ORAL | 0 refills | Status: DC | PRN

## 2016-07-01 NOTE — ED Triage Notes (Signed)
Pt states "im getting over a sinus infection" states he coughed hard yesterday and had sudden pain to right midlateral back. Pain with sittin g5 but 10 with certain movement. nad

## 2016-07-01 NOTE — Discharge Instructions (Signed)
Your xrays are negative today, I suspect you have chest wall strain such as cartilage strain from coughing.  Apply heat to the site 20 minutes several times daily.  Use the medicines as prescribed.

## 2016-07-01 NOTE — ED Provider Notes (Signed)
[facility] DEPT Provider Note   CSN: [id number] Arrival date & time: 07/01/16  1117   By signing my name below, I, Delton Prairie, attest that this documentation has been prepared under the direction and in the presence of  Evalee Jefferson, PA-C. Electronically Signed: Delton Prairie, ED Scribe. 07/01/16. 12:39 PM.   History   Chief Complaint Chief Complaint  Patient presents with  . Back Pain    HPI Comments:  Donald Chambers is a 51 y.o. male, with a PMHx of HTN, hypercholesteremia and Grave's disease, who presents to the Emergency Department complaining of sudden onset, constant, moderate, non-radiating right mid back pain x yesterday. He states he was coughing when he felt a pop to his right mid back. His pain is worse with deep coughing. He describes his cough as being dry. Pt states he was recently treated with antibiotics (Z-pak) for a sinus infection. No alleviating factors noted. Pt denies SOB, abdominal pain or any other associated symptoms. He additionally denies pain with breathing. Pt is a smoker. No drug allergies noted. No other complaints noted.   The history is provided by the patient. No language interpreter was used.    Past Medical History:  Diagnosis Date  . Hypercholesteremia   . Hypertension   . Hyperthyroidism     Patient Active Problem List   Diagnosis Date Noted  . Hypothyroidism following radioiodine therapy 05/27/2015  . Vitamin D deficiency 05/27/2015  . Hyperlipidemia 05/27/2015  . Essential hypertension, benign 05/27/2015  . Pre-diabetes 05/27/2015    History reviewed. No pertinent surgical history.   Home Medications    Prior to Admission medications   Medication Sig Start Date End Date Taking? Authorizing Provider  benzonatate (TESSALON) 100 MG capsule Take 2 capsules (200 mg total) by mouth 3 (three) times daily as needed. 07/01/16   Evalee Jefferson, PA-C  Cholecalciferol (VITAMIN D3) 5000 units CAPS Take 1 capsule (5,000 Units total) by mouth  daily. 11/27/15   Cassandria Anger, MD  ibuprofen (ADVIL,MOTRIN) 600 MG tablet Take 1 tablet (600 mg total) by mouth every 6 (six) hours as needed. 07/01/16   Evalee Jefferson, PA-C  levothyroxine (SYNTHROID, LEVOTHROID) 137 MCG tablet Take 1 tablet (137 mcg total) by mouth daily before breakfast. 05/27/16   Cassandria Anger, MD  lisinopril-hydrochlorothiazide (PRINZIDE,ZESTORETIC) 10-12.5 MG tablet TAKE ONE TABLET BY MOUTH ONCE DAILY 02/20/16   Cassandria Anger, MD  loratadine (CLARITIN) 10 MG tablet Take 10 mg by mouth daily.    Historical Provider, MD  omeprazole (PRILOSEC) 20 MG capsule Take 20 mg by mouth daily.    Historical Provider, MD  simvastatin (ZOCOR) 40 MG tablet TAKE ONE TABLET BY MOUTH AT BEDTIME 01/20/16   Cassandria Anger, MD  traMADol (ULTRAM) 50 MG tablet Take 1 tablet (50 mg total) by mouth every 6 (six) hours as needed. 07/01/16   Evalee Jefferson, PA-C    Family History Family History  Problem Relation Age of Onset  . Diabetes Father     Social History Social History  Substance Use Topics  . Smoking status: Former Smoker    Quit date: 05/20/2016  . Smokeless tobacco: Never Used  . Alcohol use No     Allergies   Patient has no known allergies.   Review of Systems Review of Systems  Constitutional: Negative.   HENT: Positive for congestion.   Respiratory: Positive for cough. Negative for shortness of breath.   Gastrointestinal: Negative for abdominal pain and nausea.  Musculoskeletal: Positive for back  pain.   Physical Exam Updated Vital Signs BP 114/80 (BP Location: Right Arm)   Pulse 70   Temp 98 F (36.7 C) (Oral)   Resp 19   Ht 5\' 11"  (1.803 m)   Wt 113.4 kg   SpO2 97%   BMI 34.87 kg/m   Physical Exam  Constitutional: He is oriented to person, place, and time. He appears well-developed and well-nourished. No distress.  HENT:  Head: Normocephalic and atraumatic.  Eyes: Conjunctivae are normal.  Cardiovascular: Normal rate.     Pulmonary/Chest: Effort normal and breath sounds normal. He has no wheezes. He has no rales.  Abdominal: He exhibits no distension.  Musculoskeletal: He exhibits tenderness.  Point tenderness to palpation along right lower ribcage at the mid axillary line. There is no crepitus or palpable deformity.  Neurological: He is alert and oriented to person, place, and time.  Skin: Skin is warm and dry.  Psychiatric: He has a normal mood and affect.  Nursing note and vitals reviewed.    ED Treatments / Results  DIAGNOSTIC STUDIES:  Oxygen Saturation is 94% on RA, adequate by my interpretation.    COORDINATION OF CARE:  12:35 PM Discussed treatment plan with pt at bedside and pt agreed to plan.  Labs (all labs ordered are listed, but only abnormal results are displayed) Labs Reviewed - No data to display  EKG  EKG Interpretation None       Radiology Dg Ribs Unilateral W/chest Right  Result Date: 07/01/2016 CLINICAL DATA:  Pain after cough EXAM: RIGHT RIBS AND CHEST - 3+ VIEW COMPARISON:  Chest radiograph April 30, 2010 FINDINGS: Frontal chest as well as oblique and cone-down lower rib images were obtained. There is no edema or consolidation. Heart size and pulmonary vascularity are normal. No adenopathy. There is aortic atherosclerosis. There is an old healed fracture of the right clavicle, stable. There is no pneumothorax or pleural effusion. No evident rib fracture. IMPRESSION: No evident rib fracture. Old healed right clavicle fracture noted. There is aortic atherosclerosis. Lungs clear. Electronically Signed   By: Lowella Grip III M.D.   On: 07/01/2016 13:16    Procedures Procedures (including critical care time)  Medications Ordered in ED Medications  benzonatate (TESSALON) capsule 200 mg (200 mg Oral Given 07/01/16 1324)     Initial Impression / Assessment and Plan / ED Course  I have reviewed the triage vital signs and the nursing notes.  Pertinent labs & imaging  results that were available during my care of the patient were reviewed by me and considered in my medical decision making (see chart for details).     Pt with reproducible ribcage pain, imaging negative, VSS, no sob or suggestion of PE. Suspect he has chest wall cartilage or or muscle strain.  He was placed on Tessalon to help with his cough, ibuprofen and tramadol when necessary for pain.  Heat therapy discussed.  Follow-up with PCP if symptoms persist or worsen.  Final Clinical Impressions(s) / ED Diagnoses   Final diagnoses:  Chest wall pain    New Prescriptions Discharge Medication List as of 07/01/2016  1:45 PM    START taking these medications   Details  benzonatate (TESSALON) 100 MG capsule Take 2 capsules (200 mg total) by mouth 3 (three) times daily as needed., Starting Wed 07/01/2016, Print    ibuprofen (ADVIL,MOTRIN) 600 MG tablet Take 1 tablet (600 mg total) by mouth every 6 (six) hours as needed., Starting Wed 07/01/2016, Print    traMADol (  ULTRAM) 50 MG tablet Take 1 tablet (50 mg total) by mouth every 6 (six) hours as needed., Starting Wed 07/01/2016, Print      I personally performed the services described in this documentation, which was scribed in my presence. The recorded information has been reviewed and is accurate.     Evalee Jefferson, PA-C 07/01/16 [place], MD 07/02/16 [phone]

## 2016-07-10 DIAGNOSIS — R0789 Other chest pain: Secondary | ICD-10-CM | POA: Diagnosis not present

## 2016-07-14 ENCOUNTER — Other Ambulatory Visit: Payer: Self-pay | Admitting: "Endocrinology

## 2016-07-15 ENCOUNTER — Telehealth: Payer: Self-pay | Admitting: "Endocrinology

## 2016-07-15 NOTE — Telephone Encounter (Signed)
rx sent

## 2016-07-15 NOTE — Telephone Encounter (Signed)
Donald Chambers is calling asking for a refill on his simvastatin (ZOCOR) 40 MG tablet sent to [facility] in [place], Please advise?

## 2016-07-29 DIAGNOSIS — H05823 Myopathy of extraocular muscles, bilateral: Secondary | ICD-10-CM | POA: Diagnosis not present

## 2016-07-29 DIAGNOSIS — E0500 Thyrotoxicosis with diffuse goiter without thyrotoxic crisis or storm: Secondary | ICD-10-CM | POA: Diagnosis not present

## 2016-07-29 DIAGNOSIS — H462 Nutritional optic neuropathy: Secondary | ICD-10-CM | POA: Diagnosis not present

## 2016-07-29 DIAGNOSIS — H0500 Unspecified acute inflammation of orbit: Secondary | ICD-10-CM | POA: Diagnosis not present

## 2016-07-29 DIAGNOSIS — H468 Other optic neuritis: Secondary | ICD-10-CM | POA: Diagnosis not present

## 2016-07-29 DIAGNOSIS — H532 Diplopia: Secondary | ICD-10-CM | POA: Diagnosis not present

## 2016-08-16 ENCOUNTER — Other Ambulatory Visit: Payer: Self-pay | Admitting: "Endocrinology

## 2016-08-19 ENCOUNTER — Other Ambulatory Visit: Payer: Self-pay | Admitting: "Endocrinology

## 2016-08-19 DIAGNOSIS — E890 Postprocedural hypothyroidism: Secondary | ICD-10-CM | POA: Diagnosis not present

## 2016-08-19 DIAGNOSIS — R7303 Prediabetes: Secondary | ICD-10-CM | POA: Diagnosis not present

## 2016-08-19 DIAGNOSIS — E559 Vitamin D deficiency, unspecified: Secondary | ICD-10-CM | POA: Diagnosis not present

## 2016-08-19 LAB — TSH: TSH: 4.21 m[IU]/L (ref 0.40–4.50)

## 2016-08-19 LAB — COMPREHENSIVE METABOLIC PANEL
ALBUMIN: 4.2 g/dL (ref 3.6–5.1)
ALK PHOS: 74 U/L (ref 40–115)
ALT: 134 U/L — AB (ref 9–46)
AST: 71 U/L — ABNORMAL HIGH (ref 10–35)
BUN: 12 mg/dL (ref 7–25)
CALCIUM: 10.1 mg/dL (ref 8.6–10.3)
CHLORIDE: 102 mmol/L (ref 98–110)
CO2: 32 mmol/L — ABNORMAL HIGH (ref 20–31)
Creat: 1.16 mg/dL (ref 0.70–1.33)
Glucose, Bld: 108 mg/dL — ABNORMAL HIGH (ref 65–99)
POTASSIUM: 4.8 mmol/L (ref 3.5–5.3)
Sodium: 139 mmol/L (ref 135–146)
TOTAL PROTEIN: 6.9 g/dL (ref 6.1–8.1)
Total Bilirubin: 0.8 mg/dL (ref 0.2–1.2)

## 2016-08-19 LAB — T4, FREE: FREE T4: 1.4 ng/dL (ref 0.8–1.8)

## 2016-08-20 LAB — VITAMIN D 25 HYDROXY (VIT D DEFICIENCY, FRACTURES): Vit D, 25-Hydroxy: 48 ng/mL (ref 30–100)

## 2016-08-27 ENCOUNTER — Encounter: Payer: Self-pay | Admitting: "Endocrinology

## 2016-08-27 ENCOUNTER — Ambulatory Visit (INDEPENDENT_AMBULATORY_CARE_PROVIDER_SITE_OTHER): Payer: PPO | Admitting: "Endocrinology

## 2016-08-27 VITALS — BP 127/84 | HR 80 | Ht 70.0 in | Wt 251.0 lb

## 2016-08-27 DIAGNOSIS — R7303 Prediabetes: Secondary | ICD-10-CM | POA: Diagnosis not present

## 2016-08-27 DIAGNOSIS — E782 Mixed hyperlipidemia: Secondary | ICD-10-CM

## 2016-08-27 DIAGNOSIS — I10 Essential (primary) hypertension: Secondary | ICD-10-CM

## 2016-08-27 DIAGNOSIS — E890 Postprocedural hypothyroidism: Secondary | ICD-10-CM

## 2016-08-27 DIAGNOSIS — E559 Vitamin D deficiency, unspecified: Secondary | ICD-10-CM | POA: Diagnosis not present

## 2016-08-27 MED ORDER — LEVOTHYROXINE SODIUM 137 MCG PO TABS: 137.0000 ug | ORAL_TABLET | Freq: Every day | ORAL | 12 refills | Status: DC

## 2016-08-27 NOTE — Progress Notes (Signed)
Subjective:    Patient ID: Donald Chambers, male    DOB: 20-Dec-1965, PCP Rosita Fire, MD   Past Medical History:  Diagnosis Date  . Hypercholesteremia   . Hypertension   . Hyperthyroidism    History reviewed. No pertinent surgical history. Social History   Social History  . Marital status: Single    Spouse name: N/A  . Number of children: N/A  . Years of education: N/A   Social History Main Topics  . Smoking status: Former Smoker    Quit date: 05/20/2016  . Smokeless tobacco: Never Used  . Alcohol use No  . Drug use: No  . Sexual activity: Not Asked   Other Topics Concern  . None   Social History Narrative  . None   Outpatient Encounter Prescriptions as of 08/27/2016  Medication Sig  . benzonatate (TESSALON) 100 MG capsule Take 2 capsules (200 mg total) by mouth 3 (three) times daily as needed.  . Cholecalciferol (VITAMIN D3) 5000 units CAPS Take 1 capsule (5,000 Units total) by mouth daily.  Marland Kitchen ibuprofen (ADVIL,MOTRIN) 600 MG tablet Take 1 tablet (600 mg total) by mouth every 6 (six) hours as needed.  Marland Kitchen levothyroxine (SYNTHROID, LEVOTHROID) 137 MCG tablet Take 1 tablet (137 mcg total) by mouth daily before breakfast.  . lisinopril-hydrochlorothiazide (PRINZIDE,ZESTORETIC) 10-12.5 MG tablet TAKE ONE TABLET BY MOUTH ONCE DAILY  . loratadine (CLARITIN) 10 MG tablet Take 10 mg by mouth daily.  Marland Kitchen omeprazole (PRILOSEC) 20 MG capsule Take 20 mg by mouth daily.  . traMADol (ULTRAM) 50 MG tablet Take 1 tablet (50 mg total) by mouth every 6 (six) hours as needed.  . [DISCONTINUED] levothyroxine (SYNTHROID, LEVOTHROID) 137 MCG tablet Take 1 tablet (137 mcg total) by mouth daily before breakfast.  . [DISCONTINUED] simvastatin (ZOCOR) 40 MG tablet TAKE ONE TABLET BY MOUTH ONCE DAILY AT BEDTIME   No facility-administered encounter medications on file as of 08/27/2016.    ALLERGIES: No Known Allergies VACCINATION STATUS:  There is no immunization history on file for this  patient.  HPI 51 yr old male with GD s/p RAI therapy on 03/24/12. He is here to f/u For RAI induced hypothyroidism. He is currently on levothyroxine 137 g by mouth every morning. He reports compliance.  He also has Hyperlipidemia on simvastatin, he tolerated his Simvastatin. He continues to f/u at [facility] for [place] ophthalmopathy. He is s/p high dose steroids. He recently was diagnosed with prediabetes with A1c of 5.9%.  - He still admits to dietary indiscretion. - He has lost 5 pounds since last visit.   Denies heat intolerance, anxiety, tremors .  He is concerned that he is losing his insurance, then may not be able to return for follow-up visit.  Review of Systems Constitutional:  no fatigue, no subjective hyperthermia/hypothermia Eyes: +blurry vision, + xerophthalmia ENT: no sore throat, no nodules palpated in throat, no dysphagia/odynophagia, no hoarseness Cardiovascular: no CP/SOB/palpitations/leg swelling Respiratory: no cough/SOB Gastrointestinal: no N/V/D/C Musculoskeletal: no muscle/joint aches Skin: no rashes Neurological: no tremors/numbness/tingling/dizziness Psychiatric: no depression/anxiety  Objective:    BP 127/84   Pulse 80   Ht 5\' 10"  (1.778 m)   Wt 251 lb (113.9 kg)   BMI 36.01 kg/m   Wt Readings from Last 3 Encounters:  08/27/16 251 lb (113.9 kg)  07/01/16 250 lb (113.4 kg)  05/27/16 256 lb (116.1 kg)    Physical Exam Constitutional: overweight, in NAD Eyes: PERRLA, EOMI, + exophthalmos ENT: moist mucous membranes, no thyromegaly, no cervical  lymphadenopathy Cardiovascular: RRR, No MRG Respiratory: CTA B Gastrointestinal: abdomen soft, NT, ND, BS+ Musculoskeletal: no deformities, strength intact in all 4 Skin: moist, warm, no rashes Neurological: no tremor with outstretched hands, DTR normal in all 4    Recent Results (from the past 2160 hour(s))  Comprehensive metabolic panel     Status: Abnormal   Collection Time: 08/19/16  7:39 AM  Result  Value Ref Range   Sodium 139 135 - 146 mmol/L   Potassium 4.8 3.5 - 5.3 mmol/L   Chloride 102 98 - 110 mmol/L   CO2 32 (H) 20 - 31 mmol/L   Glucose, Bld 108 (H) 65 - 99 mg/dL   BUN 12 7 - 25 mg/dL   Creat 1.16 0.70 - 1.33 mg/dL    Comment:   For patients > or = 50 years of age: The upper reference limit for Creatinine is approximately 13% higher for people identified as African-American.      Total Bilirubin 0.8 0.2 - 1.2 mg/dL   Alkaline Phosphatase 74 40 - 115 U/L   AST 71 (H) 10 - 35 U/L   ALT 134 (H) 9 - 46 U/L   Total Protein 6.9 6.1 - 8.1 g/dL   Albumin 4.2 3.6 - 5.1 g/dL   Calcium 10.1 8.6 - 10.3 mg/dL  TSH     Status: None   Collection Time: 08/19/16  7:39 AM  Result Value Ref Range   TSH 4.21 0.40 - 4.50 mIU/L  T4, free     Status: None   Collection Time: 08/19/16  7:39 AM  Result Value Ref Range   Free T4 1.4 0.8 - 1.8 ng/dL  VITAMIN D 25 Hydroxy (Vit-D Deficiency, Fractures)     Status: None   Collection Time: 08/19/16  7:39 AM  Result Value Ref Range   Vit D, 25-Hydroxy 48 30 - 100 ng/mL    Comment: Vitamin D Status           25-OH Vitamin D        Deficiency                <20 ng/mL        Insufficiency         20 - 29 ng/mL        Optimal             > or = 30 ng/mL   For 25-OH Vitamin D testing on patients on D2-supplementation and patients for whom quantitation of D2 and D3 fractions is required, the QuestAssureD 25-OH VIT D, (D2,D3), LC/MS/MS is recommended: order code [id number] (patients > 2 yrs).      Assessment & Plan:   1. Hypothyroidism due to  RAI -His thyroid function tests are consistent with appropriate replacement. - I will continue on levothyroxine 137 g by mouth every morning. I gave him enough refills for one year, since he states that he may lose his insurance and won't be able to return for follow-up visit until he secures another insurance coverage.   - We discussed about correct intake of levothyroxine, at fasting, with water,  separated by at least 30 minutes from breakfast, and separated by more than 4 hours from calcium, iron, multivitamins, acid reflux medications (PPIs). -Patient is made aware of the fact that thyroid hormone replacement is needed for life, dose to be adjusted by periodic monitoring of thyroid function tests. -He has Graves orbitopathy following at [facility] in [place].   2. Vitamin D  deficiency -His Vitamin D level has corrected at 63. I will continue with daily vitamin D 5000 units for 90 days.   3. Hyperlipidemia -He has tolerated his statins, however due to increasing ALT/AST I advised him to discontinue simvastatin for now. See #4  Below.  4. Transaminitis: He has associated hypertriglyceridemia. I advised him to avoid butter and fried food and stay away from alcohol.  - His hepatitis risk is not known, I advised him to address this with Dr. [name] Rams. He may need GI referral.  5. Essential hypertension, benign -Controlled. I advised him to continue lisinopril-hydrochlorothiazide and follow up with Dr. [name] Rams for primary care.  5) prediabetes: his last A1c is stable at 5.9%. He will not need medications at this point. I have reemphasized the need for carb restriction and adequate protein diet.  - I advised patient to maintain close follow up with Rosita Fire, MD for primary care needs. Follow up plan: Return in about 6 months (around 02/26/2017) for follow up with pre-visit labs.  Glade Lloyd, MD Phone: [phone]  Fax: [phone]   08/27/2016, 10:56 AM

## 2016-08-27 NOTE — Patient Instructions (Signed)

## 2016-09-15 DIAGNOSIS — R945 Abnormal results of liver function studies: Secondary | ICD-10-CM | POA: Diagnosis not present

## 2016-09-15 DIAGNOSIS — I10 Essential (primary) hypertension: Secondary | ICD-10-CM | POA: Diagnosis not present

## 2016-09-15 DIAGNOSIS — Z0000 Encounter for general adult medical examination without abnormal findings: Secondary | ICD-10-CM | POA: Diagnosis not present

## 2016-09-15 DIAGNOSIS — E039 Hypothyroidism, unspecified: Secondary | ICD-10-CM | POA: Diagnosis not present

## 2016-09-21 ENCOUNTER — Encounter: Payer: Self-pay | Admitting: Gastroenterology

## 2016-11-11 ENCOUNTER — Ambulatory Visit: Payer: PPO | Admitting: Nurse Practitioner

## 2016-12-31 ENCOUNTER — Telehealth: Payer: Self-pay | Admitting: Nurse Practitioner

## 2016-12-31 ENCOUNTER — Ambulatory Visit: Payer: PPO | Admitting: Nurse Practitioner

## 2016-12-31 ENCOUNTER — Encounter: Payer: Self-pay | Admitting: Nurse Practitioner

## 2016-12-31 NOTE — Telephone Encounter (Signed)
Noted  

## 2016-12-31 NOTE — Telephone Encounter (Signed)
PATIENT WAS A NO SHOW AND LETTER SENT  °

## 2017-02-01 DIAGNOSIS — Z139 Encounter for screening, unspecified: Secondary | ICD-10-CM

## 2017-02-01 LAB — GLUCOSE, POCT (MANUAL RESULT ENTRY): POC Glucose: 104 mg/dl — AB (ref 70–99)

## 2017-02-01 NOTE — Congregational Nurse Program (Signed)
Congregational Nurse Program Note  Date of Encounter: 02/01/2017  Past Medical History: Past Medical History:  Diagnosis Date  . Hypercholesteremia   . Hypertension   . Hyperthyroidism     Encounter Details: CNP Questionnaire - 02/01/17 1000      Questionnaire   Patient Status  Not Applicable    Race  White or Caucasian    Location Patient Served At  [facility]  Not Applicable    Uninsured  Uninsured (NEW 1x/quarter)    Food  No food insecurities    Housing/Utilities  Yes, have permanent housing    Transportation  No transportation needs    Interpersonal Safety  Yes, feel physically and emotionally safe where you currently live    Medication  No medication insecurities    Medical Provider  No    Referrals  [facility];Primary Care Provider/Clinic    ED Visit Averted  Not Applicable    Life-Saving Intervention Made  Not Applicable      New client to [facility]. Client is seeking help navigating into a primary care provider particularly [facility] as he was a patient there a few years ago. Client had been awarded disability and had gotten [employer] / Medicare, but states as of June 30th 2018 he is no longer eligible and does not have any insurance or income. He is appealing for his disability. He also states he applied for disability medicaid recently but was denied. Client has been seeing Dr. [name] Rams as primary care and also seeing Dr. Dorris Fetch for endocrinology. He currently lives with his parents who assist with his medications.  Past Medical History: Graves Disease: radioiodine therapy Hypertension GERD Vit D deficiency Prediabetes  Current Medications: Lisinopril/HCTZ 10/12.5 mg one by mouth daily Simvastatin 40 mg one by mouth daily Levothyroxine 137 mcg by mouth daily Omeprazole 20 mg by mouth daily Vitamin D3 4000 units by mouth daily (per patient)  Client uncertain when he saw Dr. [name] Rams last possibly in June, he has seen  Dr. Dorris Fetch last in June 2018. He states he still has all his medications and Dr. Dorris Fetch prescribed his levothyroxin for one year knowing he was losing his insurance per client. Alert and oriented to person, place and time, answers all questions appropriately. He is accompanied by his mother who states she keeps up with all his medicines and appointments. Client states he was at one time seeing ophthalmologist at [facility] for his eye problems associated with Graves, but has not seen them since January 2018. Client reports that his has decreased vision in his right eye. Currently wears glasses and states he tries not to drive due to his vision issues. RN counseled him on safety with decreased vision and depth perception. Client states understanding. Client's concern is to get a primary medical provider while he has no income of insurance. He cannot afford to pay out of pocket cost. He states he currently has all his medications. He complaint today also is cold like symptoms that started 7 days ago today. He does not report any fever or chills. Temp today 98.1 orally. Complains of a "tickling throat" with cough that is sometimes productive he states lightly tinted green at times. His lungs sounds are clear at present. He does have an occasional cough but non productive during visit. Oxygen saturation 97% denies shortness of breath, denies chest pains except when he coughs. He does report having chest wall pain due to cough when he was  seen in the [place] in April. He states he was given tessalon pearls that helped. Discussed smoking cessation with client and he states he's smoking less than he used to and talked about how smoking can make him more susceptible to respiratory issues. Handout given regarding smoking cessation. Encouraged client to drink plenty of fluids such as water and to get rest. Client confirms head congestion also. Denies any abdominal pains.  Referral made to the [facility] and appointment  secured for next day 02/02/17 at 1 pm. Client encouraged to take all his medications with him to his appointment.  Client states he is taking Coricidin over the counter currently also.  Will follow as needed.  Encouraged client to talk to DSS or go by to get a copy of his medicaid denial letter. Discussed with client about [place] Med assist program as well as [facility] application. Client's mother states that he did recently have a Gastro appointment but canceled due to no insurance. She states they sent her a [facility] application but she has not filled it out yet, she was awaiting some papers such as food stamp award letter etc.  Encouraged client to continue that process and that the [facility] may have those applications also and the importance of following through with those applications.Client states understanding.

## 2017-02-02 ENCOUNTER — Other Ambulatory Visit: Payer: Self-pay | Admitting: Physician Assistant

## 2017-02-02 ENCOUNTER — Encounter: Payer: Self-pay | Admitting: Physician Assistant

## 2017-02-02 ENCOUNTER — Ambulatory Visit: Payer: Self-pay | Admitting: Physician Assistant

## 2017-02-02 VITALS — BP 120/80 | HR 81 | Ht 71.0 in | Wt 248.0 lb

## 2017-02-02 DIAGNOSIS — I10 Essential (primary) hypertension: Secondary | ICD-10-CM

## 2017-02-02 DIAGNOSIS — E0500 Thyrotoxicosis with diffuse goiter without thyrotoxic crisis or storm: Secondary | ICD-10-CM

## 2017-02-02 DIAGNOSIS — Z1211 Encounter for screening for malignant neoplasm of colon: Secondary | ICD-10-CM

## 2017-02-02 DIAGNOSIS — K219 Gastro-esophageal reflux disease without esophagitis: Secondary | ICD-10-CM

## 2017-02-02 DIAGNOSIS — R7303 Prediabetes: Secondary | ICD-10-CM

## 2017-02-02 DIAGNOSIS — Z125 Encounter for screening for malignant neoplasm of prostate: Secondary | ICD-10-CM

## 2017-02-02 DIAGNOSIS — J209 Acute bronchitis, unspecified: Secondary | ICD-10-CM

## 2017-02-02 DIAGNOSIS — E890 Postprocedural hypothyroidism: Secondary | ICD-10-CM

## 2017-02-02 DIAGNOSIS — E782 Mixed hyperlipidemia: Secondary | ICD-10-CM

## 2017-02-02 DIAGNOSIS — F17219 Nicotine dependence, cigarettes, with unspecified nicotine-induced disorders: Secondary | ICD-10-CM

## 2017-02-02 MED ORDER — BENZONATATE 100 MG PO CAPS: ORAL_CAPSULE | ORAL | 2 refills | Status: DC

## 2017-02-02 MED ORDER — PREDNISONE 10 MG PO TABS: ORAL_TABLET | ORAL | 0 refills | Status: AC

## 2017-02-02 NOTE — Patient Instructions (Addendum)
mucinex over-the-counter Rest.  Fluids.  rx medications Get fasting labs drawn Return stool test to office  -------------------------------------- Smoking Tobacco Information Smoking tobacco will very likely harm your health. Tobacco contains a poisonous (toxic), colorless chemical called nicotine. Nicotine affects the brain and makes tobacco addictive. This change in your brain can make it hard to stop smoking. Tobacco also has other toxic chemicals that can hurt your body and raise your risk of many cancers. How can smoking tobacco affect me? Smoking tobacco can increase your chances of having serious health conditions, such as:  Cancer. Smoking is most commonly associated with lung cancer, but can lead to cancer in other parts of the body.  Chronic obstructive pulmonary disease (COPD). This is a long-term lung condition that makes it hard to breathe. It also gets worse over time.  High blood pressure (hypertension), heart disease, stroke, or heart attack.  Lung infections, such as pneumonia.  Cataracts. This is when the lenses in the eyes become clouded.  Digestive problems. This may include peptic ulcers, heartburn, and gastroesophageal reflux disease (GERD).  Oral health problems, such as gum disease and tooth loss.  Loss of taste and smell.  Smoking can affect your appearance by causing:  Wrinkles.  Yellow or stained teeth, fingers, and fingernails.  Smoking tobacco can also affect your social life.  Many workplaces, [employer], hotels, and public places are tobacco-free. This means that you may experience challenges in finding places to smoke when away from home.  The cost of a smoking habit can be expensive. Expenses for someone who smokes come in two ways: ? You spend money on a regular basis to buy tobacco. ? Your health care costs in the long-term are higher if you smoke.  Tobacco smoke can also affect the health of those around you. Children of smokers have  greater chances of: ? Sudden infant death syndrome (SIDS). ? Ear infections. ? Lung infections.  What lifestyle changes can be made?  Do not start smoking. Quit if you already do.  To quit smoking: ? Make a plan to quit smoking and commit yourself to it. Look for programs to help you and ask your health care provider for recommendations and ideas. ? Talk with your health care provider about using nicotine replacement medicines to help you quit. Medicine replacement medicines include gum, lozenges, patches, sprays, or pills. ? Do not replace cigarette smoking with electronic cigarettes, which are commonly called e-cigarettes. The safety of e-cigarettes is not known, and some may contain harmful chemicals. ? Avoid places, people, or situations that tempt you to smoke. ? If you try to quit but return to smoking, don't give up hope. It is very common for people to try a number of times before they fully succeed. When you feel ready again, give it another try.  Quitting smoking might affect the way you eat as well as your weight. Be prepared to monitor your eating habits. Get support in planning and following a healthy diet.  Ask your health care provider about having regular tests (screenings) to check for cancer. This may include blood tests, imaging tests, and other tests.  Exercise regularly. Consider taking walks, joining a gym, or doing yoga or exercise classes.  Develop skills to manage your stress. These skills include meditation. What are the benefits of quitting smoking? By quitting smoking, you may:  Lower your risk of getting cancer and other diseases caused by smoking.  Live longer.  Breathe better.  Lower your blood pressure and heart  rate.  Stop your addiction to tobacco.  Stop creating secondhand smoke that hurts other people.  Improve your sense of taste and smell.  Look better over time, due to having fewer wrinkles and less staining.  What can happen if changes  are not made? If you do not stop smoking, you may:  Get cancer and other diseases.  Develop COPD or other long-term (chronic) lung conditions.  Develop serious problems with your heart and blood vessels (cardiovascular system).  Need more tests to screen for problems caused by smoking.  Have higher, long-term healthcare costs from medicines or treatments related to smoking.  Continue to have worsening changes in your lungs, mouth, and nose.  Where to find support: To get support to quit smoking, consider:  Asking your health care provider for more information and resources.  Taking classes to learn more about quitting smoking.  Looking for local organizations that offer resources about quitting smoking.  Joining a support group for people who want to quit smoking in your local community.  Where to find more information: You may find more information about quitting smoking from:  HelpGuide.org: www.helpguide.org/articles/addictions/how-to-quit-smoking.htm  https://hall.com/: smokefree.gov  American Lung Association: www.lung.org  Contact a health care provider if:  You have problems breathing.  Your lips, nose, or fingers turn blue.  You have chest pain.  You are coughing up blood.  You feel faint or you pass out.  You have other noticeable changes that cause you to worry. Summary  Smoking tobacco can negatively affect your health, the health of those around you, your finances, and your social life.  Do not start smoking. Quit if you already do. If you need help quitting, ask your health care provider.  Think about joining a support group for people who want to quit smoking in your local community. There are many effective programs that will help you to quit this behavior. This information is not intended to replace advice given to you by your health care provider. Make sure you discuss any questions you have with your health care provider. Document Released:  03/24/2016 Document Revised: 03/24/2016 Document Reviewed: 03/24/2016 Elsevier Interactive Patient Education  [employer].

## 2017-02-02 NOTE — Progress Notes (Signed)
BP 120/80   Pulse 81   Ht 5\' 11"  (1.803 m)   Wt 248 lb (112.5 kg)   SpO2 97%   BMI 34.59 kg/m    Subjective:    Patient ID: Donald Chambers, male    DOB: [birth year], 51 y.o.   MRN: [id number]  HPI: Donald Chambers is a 51 y.o. male presenting on 02/02/2017 for New Patient (Initial Visit) (pt is being seen by Dr. Dorris Fetch. pt last saw Dr. [name] Rams in June, 2018)   HPI   Pt feels pretty good today but is really congested, stopped up, coughing.  He says it started 8 days ago.  He is using some coricidin.  He continues to smoke  Pt last went to eye dr in [place] in May.  He is supposed to return this month or next month but cannot afford to due to loss of insurance.  He was seen for his Graves' ophthalmopathy.  He was seen for that in the past at [facility].  Pt used to have insurance but now has none.    Pt says his R eye is blurry- can see nothing but the blur.  He says his L eye is good- 20/40 at last check.    Relevant past medical, surgical, family and social history reviewed and updated as indicated. Interim medical history since our last visit reviewed. Allergies and medications reviewed and updated.   Current Outpatient Medications:  .  cetirizine (ZYRTEC) 10 MG tablet, Take 10 mg daily by mouth., Disp: , Rfl:  .  Cholecalciferol (VITAMIN D3) 5000 units CAPS, Take 1 capsule (5,000 Units total) by mouth daily., Disp: 90 capsule, Rfl: 0 .  levothyroxine (SYNTHROID, LEVOTHROID) 137 MCG tablet, Take 1 tablet (137 mcg total) by mouth daily before breakfast., Disp: 30 tablet, Rfl: 12 .  lisinopril-hydrochlorothiazide (PRINZIDE,ZESTORETIC) 10-12.5 MG tablet, TAKE ONE TABLET BY MOUTH ONCE DAILY, Disp: 90 tablet, Rfl: 1 .  omeprazole (PRILOSEC) 20 MG capsule, Take 20 mg by mouth daily., Disp: , Rfl:  .  simvastatin (ZOCOR) 40 MG tablet, Take 40 mg daily by mouth., Disp: , Rfl:    Review of Systems  Constitutional: Positive for diaphoresis. Negative for appetite change, chills,  fatigue, fever and unexpected weight change.  HENT: Positive for congestion and sneezing. Negative for dental problem, drooling, ear pain, facial swelling, hearing loss, mouth sores, sore throat, trouble swallowing and voice change.   Eyes: Positive for redness and visual disturbance. Negative for pain, discharge and itching.  Respiratory: Positive for cough. Negative for choking, shortness of breath and wheezing.   Cardiovascular: Negative for chest pain, palpitations and leg swelling.  Gastrointestinal: Negative for abdominal pain, blood in stool, constipation, diarrhea and vomiting.  Endocrine: Positive for heat intolerance. Negative for cold intolerance and polydipsia.  Genitourinary: Negative for decreased urine volume, dysuria and hematuria.  Musculoskeletal: Negative for arthralgias, back pain and gait problem.  Skin: Negative for rash.  Allergic/Immunologic: Positive for environmental allergies.  Neurological: Negative for seizures, syncope, light-headedness and headaches.  Hematological: Negative for adenopathy.  Psychiatric/Behavioral: Negative for agitation, dysphoric mood and suicidal ideas. The patient is not nervous/anxious.     Per HPI unless specifically indicated above     Objective:    BP 120/80   Pulse 81   Ht 5\' 11"  (1.803 m)   Wt 248 lb (112.5 kg)   SpO2 97%   BMI 34.59 kg/m   Wt Readings from Last 3 Encounters:  02/02/17 248 lb (112.5 kg)  02/01/17 248  lb 12.8 oz (112.9 kg)  08/27/16 251 lb (113.9 kg)    Physical Exam  Constitutional: He is oriented to person, place, and time. He appears well-developed and well-nourished.  HENT:  Head: Normocephalic and atraumatic.  Mouth/Throat: Oropharynx is clear and moist. No oropharyngeal exudate.  Eyes: Conjunctivae are normal. Pupils are equal, round, and reactive to light. Right eye exhibits abnormal extraocular motion.  R eye protuberant and with disconjugate gaze.   Neck: Neck supple. No thyromegaly present.   Cardiovascular: Normal rate and regular rhythm.  Pulmonary/Chest: Effort normal. No respiratory distress. He has wheezes. He has no rhonchi. He has no rales.  Abdominal: Soft. Bowel sounds are normal. He exhibits no mass. There is no hepatosplenomegaly. There is no tenderness. A hernia is present. Hernia confirmed positive in the ventral area.  Musculoskeletal: He exhibits no edema.  Lymphadenopathy:    He has no cervical adenopathy.  Neurological: He is alert and oriented to person, place, and time.  Skin: Skin is warm and dry. No rash noted.  Psychiatric: He has a normal mood and affect. His behavior is normal. Thought content normal.  Vitals reviewed.       Assessment & Plan:   Encounter Diagnoses  Name Primary?  . Essential hypertension, benign Yes  . Mixed hyperlipidemia   . Hypothyroidism following radioiodine therapy   . Pre-diabetes   . Gastroesophageal reflux disease, esophagitis presence not specified   . Screening for prostate cancer   . Acute bronchitis, unspecified organism   . Screening for colon cancer   . Cigarette nicotine dependence with nicotine-induced disorder   . Graves' ophthalmopathy      -pt will get Fasting labs tomorrow morning -pt is given iFOBT for colon cancer screening -will Refer pt to ophthalmology for Graves' ophthalmopathy -for bronchitis, counseled smoking cessation.  rx Prednisone and tessalon.  Recommended mucinex OTC. Rest. Fluids. -pt to continue current medications.  He has refills of all -pt to follow up 1 month.  RTO sooner prn.dx

## 2017-02-08 ENCOUNTER — Other Ambulatory Visit (HOSPITAL_COMMUNITY)
Admission: RE | Admit: 2017-02-08 | Discharge: 2017-02-08 | Disposition: A | Discharge status: Home / Self Care | Payer: PPO | Source: Ambulatory Visit | Attending: Physician Assistant | Admitting: Physician Assistant

## 2017-02-08 DIAGNOSIS — R7303 Prediabetes: Secondary | ICD-10-CM | POA: Insufficient documentation

## 2017-02-08 DIAGNOSIS — J209 Acute bronchitis, unspecified: Secondary | ICD-10-CM | POA: Insufficient documentation

## 2017-02-08 DIAGNOSIS — E890 Postprocedural hypothyroidism: Secondary | ICD-10-CM | POA: Insufficient documentation

## 2017-02-08 DIAGNOSIS — E782 Mixed hyperlipidemia: Secondary | ICD-10-CM | POA: Insufficient documentation

## 2017-02-08 DIAGNOSIS — I10 Essential (primary) hypertension: Secondary | ICD-10-CM

## 2017-02-08 DIAGNOSIS — Z125 Encounter for screening for malignant neoplasm of prostate: Secondary | ICD-10-CM

## 2017-02-08 LAB — TSH: TSH: 1.139 u[IU]/mL (ref 0.350–4.500)

## 2017-02-08 LAB — CBC
HCT: 49.5 % (ref 39.0–52.0)
HEMOGLOBIN: 16.4 g/dL (ref 13.0–17.0)
MCH: 29.7 pg (ref 26.0–34.0)
MCHC: 33.1 g/dL (ref 30.0–36.0)
MCV: 89.7 fL (ref 78.0–100.0)
PLATELETS: 329 10*3/uL (ref 150–400)
RBC: 5.52 MIL/uL (ref 4.22–5.81)
RDW: 13.1 % (ref 11.5–15.5)
WBC: 14 10*3/uL — AB (ref 4.0–10.5)

## 2017-02-08 LAB — COMPREHENSIVE METABOLIC PANEL
ALT: 105 U/L — AB (ref 17–63)
ANION GAP: 8 (ref 5–15)
AST: 38 U/L (ref 15–41)
Albumin: 4.1 g/dL (ref 3.5–5.0)
Alkaline Phosphatase: 70 U/L (ref 38–126)
BUN: 18 mg/dL (ref 6–20)
CHLORIDE: 101 mmol/L (ref 101–111)
CO2: 30 mmol/L (ref 22–32)
CREATININE: 1.03 mg/dL (ref 0.61–1.24)
Calcium: 10 mg/dL (ref 8.9–10.3)
Glucose, Bld: 104 mg/dL — ABNORMAL HIGH (ref 65–99)
Potassium: 3.8 mmol/L (ref 3.5–5.1)
SODIUM: 139 mmol/L (ref 135–145)
Total Bilirubin: 0.7 mg/dL (ref 0.3–1.2)
Total Protein: 7.5 g/dL (ref 6.5–8.1)

## 2017-02-08 LAB — HEMOGLOBIN A1C
HEMOGLOBIN A1C: 6.1 % — AB (ref 4.8–5.6)
Mean Plasma Glucose: 128.37 mg/dL

## 2017-02-08 LAB — LIPID PANEL
CHOL/HDL RATIO: 4.9 ratio
CHOLESTEROL: 188 mg/dL (ref 0–200)
HDL: 38 mg/dL — AB (ref >40)
LDL Cholesterol: 116 mg/dL — ABNORMAL HIGH (ref 0–99)
Triglycerides: 168 mg/dL — ABNORMAL HIGH (ref <150)
VLDL: 34 mg/dL (ref 0–40)

## 2017-02-08 LAB — PSA: PROSTATIC SPECIFIC ANTIGEN: 0.64 ng/mL (ref 0.00–4.00)

## 2017-02-08 LAB — IFOBT (OCCULT BLOOD): IMMUNOLOGICAL FECAL OCCULT BLOOD TEST: NEGATIVE

## 2017-02-09 ENCOUNTER — Telehealth: Payer: Self-pay

## 2017-02-09 NOTE — Telephone Encounter (Signed)
Pt was seen here at the [facility] on 02/01/17 for help navigating back into the [facility] as chief complaint. Pt was screened and an appointment was secured for 02/02/2017 at 1 [facility].  Pt was called today 02/09/17 for follow-up. Pt states he is doing good, up to date on his medication and just yesterday went and got his blood drawn for labs.   Hunner Garcon R. [place], LPN  [phone]

## 2017-03-03 ENCOUNTER — Other Ambulatory Visit: Payer: Self-pay | Admitting: Physician Assistant

## 2017-03-03 ENCOUNTER — Ambulatory Visit: Payer: Self-pay | Admitting: Physician Assistant

## 2017-03-03 ENCOUNTER — Encounter: Payer: Self-pay | Admitting: Physician Assistant

## 2017-03-03 VITALS — BP 136/80 | HR 96 | Temp 97.9°F | Ht 71.0 in | Wt 248.5 lb

## 2017-03-03 DIAGNOSIS — F17219 Nicotine dependence, cigarettes, with unspecified nicotine-induced disorders: Secondary | ICD-10-CM

## 2017-03-03 DIAGNOSIS — R7303 Prediabetes: Secondary | ICD-10-CM

## 2017-03-03 DIAGNOSIS — Z550 Illiteracy and low-level literacy: Secondary | ICD-10-CM | POA: Insufficient documentation

## 2017-03-03 DIAGNOSIS — I10 Essential (primary) hypertension: Secondary | ICD-10-CM

## 2017-03-03 DIAGNOSIS — R945 Abnormal results of liver function studies: Secondary | ICD-10-CM

## 2017-03-03 DIAGNOSIS — K219 Gastro-esophageal reflux disease without esophagitis: Secondary | ICD-10-CM | POA: Insufficient documentation

## 2017-03-03 DIAGNOSIS — E669 Obesity, unspecified: Secondary | ICD-10-CM | POA: Insufficient documentation

## 2017-03-03 DIAGNOSIS — R7989 Other specified abnormal findings of blood chemistry: Secondary | ICD-10-CM

## 2017-03-03 DIAGNOSIS — E782 Mixed hyperlipidemia: Secondary | ICD-10-CM

## 2017-03-03 DIAGNOSIS — Z1159 Encounter for screening for other viral diseases: Secondary | ICD-10-CM

## 2017-03-03 DIAGNOSIS — E890 Postprocedural hypothyroidism: Secondary | ICD-10-CM

## 2017-03-03 MED ORDER — LISINOPRIL-HYDROCHLOROTHIAZIDE 10-12.5 MG PO TABS: 1.0000 | ORAL_TABLET | Freq: Every day | ORAL | 1 refills | Status: DC

## 2017-03-03 MED ORDER — ATORVASTATIN CALCIUM 20 MG PO TABS: 20.0000 mg | ORAL_TABLET | Freq: Every day | ORAL | 1 refills | Status: DC

## 2017-03-03 MED ORDER — LEVOTHYROXINE SODIUM 137 MCG PO TABS: 137.0000 ug | ORAL_TABLET | Freq: Every day | ORAL | 1 refills | Status: DC

## 2017-03-03 MED ORDER — OMEPRAZOLE 20 MG PO CPDR: 40.0000 mg | DELAYED_RELEASE_CAPSULE | Freq: Every day | ORAL | 1 refills | Status: DC

## 2017-03-03 NOTE — Progress Notes (Signed)
BP 136/80 (BP Location: Left Arm, Patient Position: Sitting, Cuff Size: Large)   Pulse 96   Temp 97.9 F (36.6 C) (Other (Comment))   Ht 5\' 11"  (1.803 m)   Wt 248 lb 8 oz (112.7 kg)   SpO2 98%   BMI 34.66 kg/m    Subjective:    Patient ID: Donald Chambers, male    DOB: Jan 21, 1966, 51 y.o.   MRN: [id number]  HPI: Donald Chambers is a 51 y.o. male presenting on 03/03/2017 for Follow-up   HPI   Pt says he can't really read very well.   Pt says he has not gotten phone call from [facility] on his eye referral.  He says that he doesn't want to go to [facility] for his eyes.  He says that they can't really do anything for him and he doesn't really have the funds to drive there.    Pt without complaint today.   Relevant past medical, surgical, family and social history reviewed and updated as indicated. Interim medical history since our last visit reviewed. Allergies and medications reviewed and updated.  Current Outpatient Medications:  .  benzonatate (TESSALON) 100 MG capsule, 1-2 po q 8 hour prn cough, Disp: 20 capsule, Rfl: 2 .  cetirizine (ZYRTEC) 10 MG tablet, Take 10 mg daily by mouth., Disp: , Rfl:  .  Cholecalciferol (VITAMIN D3) 5000 units CAPS, Take 1 capsule (5,000 Units total) by mouth daily., Disp: 90 capsule, Rfl: 0 .  levothyroxine (SYNTHROID, LEVOTHROID) 137 MCG tablet, Take 1 tablet (137 mcg total) by mouth daily before breakfast., Disp: 30 tablet, Rfl: 12 .  lisinopril-hydrochlorothiazide (PRINZIDE,ZESTORETIC) 10-12.5 MG tablet, TAKE ONE TABLET BY MOUTH ONCE DAILY, Disp: 90 tablet, Rfl: 1 .  omeprazole (PRILOSEC) 20 MG capsule, Take 20 mg by mouth daily., Disp: , Rfl:  .  simvastatin (ZOCOR) 40 MG tablet, Take 40 mg daily by mouth., Disp: , Rfl:     Review of Systems  Constitutional: Negative for appetite change, chills, diaphoresis, fatigue, fever and unexpected weight change.  HENT: Negative for congestion, dental problem, drooling, ear pain, facial swelling, hearing  loss, mouth sores, sneezing, sore throat, trouble swallowing and voice change.   Eyes: Negative for pain, discharge, redness, itching and visual disturbance.  Respiratory: Negative for cough, choking, shortness of breath and wheezing.   Cardiovascular: Negative for chest pain, palpitations and leg swelling.  Gastrointestinal: Negative for abdominal pain, blood in stool, constipation, diarrhea and vomiting.  Endocrine: Positive for heat intolerance. Negative for cold intolerance and polydipsia.  Genitourinary: Negative for decreased urine volume, dysuria and hematuria.  Musculoskeletal: Negative for arthralgias, back pain and gait problem.  Skin: Negative for rash.  Allergic/Immunologic: Negative for environmental allergies.  Neurological: Negative for seizures, syncope, light-headedness and headaches.  Hematological: Negative for adenopathy.  Psychiatric/Behavioral: Negative for agitation, dysphoric mood and suicidal ideas. The patient is not nervous/anxious.     Per HPI unless specifically indicated above     Objective:    BP 136/80 (BP Location: Left Arm, Patient Position: Sitting, Cuff Size: Large)   Pulse 96   Temp 97.9 F (36.6 C) (Other (Comment))   Ht 5\' 11"  (1.803 m)   Wt 248 lb 8 oz (112.7 kg)   SpO2 98%   BMI 34.66 kg/m   Wt Readings from Last 3 Encounters:  03/03/17 248 lb 8 oz (112.7 kg)  02/02/17 248 lb (112.5 kg)  02/01/17 248 lb 12.8 oz (112.9 kg)    Physical Exam  Constitutional: He  is oriented to person, place, and time. He appears well-developed and well-nourished.  HENT:  Head: Normocephalic and atraumatic.  Neck: Neck supple.  Cardiovascular: Normal rate and regular rhythm.  Pulmonary/Chest: Effort normal and breath sounds normal. He has no wheezes.  Abdominal: Soft. Bowel sounds are normal. There is no hepatosplenomegaly. There is no tenderness.  Musculoskeletal: He exhibits no edema.  Lymphadenopathy:    He has no cervical adenopathy.  Neurological:  He is alert and oriented to person, place, and time.  Skin: Skin is warm and dry.  Psychiatric: He has a normal mood and affect. His behavior is normal.  Vitals reviewed.   Results for orders placed or performed during the hospital encounter of 02/08/17  PSA  Result Value Ref Range   Prostatic Specific Antigen 0.64 0.00 - 4.00 ng/mL  Comprehensive metabolic panel  Result Value Ref Range   Sodium 139 135 - 145 mmol/L   Potassium 3.8 3.5 - 5.1 mmol/L   Chloride 101 101 - 111 mmol/L   CO2 30 22 - 32 mmol/L   Glucose, Bld 104 (H) 65 - 99 mg/dL   BUN 18 6 - 20 mg/dL   Creatinine, Ser 1.03 0.61 - 1.24 mg/dL   Calcium 10.0 8.9 - 10.3 mg/dL   Total Protein 7.5 6.5 - 8.1 g/dL   Albumin 4.1 3.5 - 5.0 g/dL   AST 38 15 - 41 U/L   ALT 105 (H) 17 - 63 U/L   Alkaline Phosphatase 70 38 - 126 U/L   Total Bilirubin 0.7 0.3 - 1.2 mg/dL   GFR calc non Af Amer >60 >60 mL/min   GFR calc Af Amer >60 >60 mL/min   Anion gap 8 5 - 15  CBC  Result Value Ref Range   WBC 14.0 (H) 4.0 - 10.5 K/uL   RBC 5.52 4.22 - 5.81 MIL/uL   Hemoglobin 16.4 13.0 - 17.0 g/dL   HCT 49.5 39.0 - 52.0 %   MCV 89.7 78.0 - 100.0 fL   MCH 29.7 26.0 - 34.0 pg   MCHC 33.1 30.0 - 36.0 g/dL   RDW 13.1 11.5 - 15.5 %   Platelets 329 150 - 400 K/uL  Lipid panel  Result Value Ref Range   Cholesterol 188 0 - 200 mg/dL   Triglycerides 168 (H) <150 mg/dL   HDL 38 (L) >40 mg/dL   Total CHOL/HDL Ratio 4.9 RATIO   VLDL 34 0 - 40 mg/dL   LDL Cholesterol 116 (H) 0 - 99 mg/dL  HgB A1c  Result Value Ref Range   Hgb A1c MFr Bld 6.1 (H) 4.8 - 5.6 %   Mean Plasma Glucose 128.37 mg/dL  TSH  Result Value Ref Range   TSH 1.139 0.350 - 4.500 uIU/mL      Assessment & Plan:    Encounter Diagnoses  Name Primary?  . Essential hypertension, benign Yes  . Mixed hyperlipidemia   . Need for hepatitis C screening test   . Hypothyroidism following radioiodine therapy   . Elevated LFTs   . Pre-diabetes   . Gastroesophageal reflux  disease, esophagitis presence not specified   . Cigarette nicotine dependence with nicotine-induced disorder   . Obesity, unspecified classification, unspecified obesity type, unspecified whether serious comorbidity present   . Unable to read or write     -reviewed labs with pt -Check hepatitis blood test.  He says someone told him in the past that he needed to get with specialist but he doesn't know if he had  hepatitis or not.  -low fat diet given and change to atorvastatin -prediabetic diet given -smoking cessation counseled -pt says he doesn't want to go to [facility] for [facility] eyes -he is supposed to take omeprazole 40 but only takes 20 due to cost.  -sign up for [facility].

## 2017-03-03 NOTE — Patient Instructions (Signed)
Prediabetes Eating Plan Prediabetes-also called impaired glucose tolerance or impaired fasting glucose-is a condition that causes blood sugar (blood glucose) levels to be higher than normal. Following a healthy diet can help to keep prediabetes under control. It can also help to lower the risk of type 2 diabetes and heart disease, which are increased in people who have prediabetes. Along with regular exercise, a healthy diet:  Promotes weight loss.  Helps to control blood sugar levels.  Helps to improve the way that the body uses insulin.  What do I need to know about this eating plan?  Use the glycemic index (GI) to plan your meals. The index tells you how quickly a food will raise your blood sugar. Choose low-GI foods. These foods take a longer time to raise blood sugar.  Pay close attention to the amount of carbohydrates in the food that you eat. Carbohydrates increase blood sugar levels.  Keep track of how many calories you take in. Eating the right amount of calories will help you to achieve a healthy weight. Losing about 7 percent of your starting weight can help to prevent type 2 diabetes.  You may want to follow a Mediterranean diet. This diet includes a lot of vegetables, lean meats or fish, whole grains, fruits, and healthy oils and fats. What foods can I eat? Grains Whole grains, such as whole-wheat or whole-grain breads, crackers, cereals, and pasta. Unsweetened oatmeal. Bulgur. Barley. Quinoa. Brown rice. Corn or whole-wheat flour tortillas or taco shells. Vegetables Lettuce. Spinach. Peas. Beets. Cauliflower. Cabbage. Broccoli. Carrots. Tomatoes. Squash. Eggplant. Herbs. Peppers. Onions. Cucumbers. Brussels sprouts. Fruits Berries. Bananas. Apples. Oranges. Grapes. Papaya. Mango. Pomegranate. Kiwi. Grapefruit. Cherries. Meats and Other Protein Sources Seafood. Lean meats, such as chicken and [place] or lean cuts of pork and beef. Tofu. Eggs. Nuts. Beans. Dairy Low-fat or  fat-free dairy products, such as yogurt, cottage cheese, and cheese. Beverages Water. Tea. Coffee. Sugar-free or diet soda. Seltzer water. Milk. Milk alternatives, such as soy or almond milk. Condiments Mustard. Relish. Low-fat, low-sugar ketchup. Low-fat, low-sugar barbecue sauce. Low-fat or fat-free mayonnaise. Sweets and Desserts Sugar-free or low-fat pudding. Sugar-free or low-fat ice cream and other frozen treats. Fats and Oils Avocado. Walnuts. Olive oil. The items listed above may not be a complete list of recommended foods or beverages. Contact your dietitian for more options. What foods are not recommended? Grains Refined white flour and flour products, such as bread, pasta, snack foods, and cereals. Beverages Sweetened drinks, such as sweet iced tea and soda. Sweets and Desserts Baked goods, such as cake, cupcakes, pastries, cookies, and cheesecake. The items listed above may not be a complete list of foods and beverages to avoid. Contact your dietitian for more information. This information is not intended to replace advice given to you by your health care provider. Make sure you discuss any questions you have with your health care provider. Document Released: 07/24/2014 Document Revised: 08/15/2015 Document Reviewed: 04/04/2014 Elsevier Interactive Patient Education  2017 [employer]     Fat and Cholesterol Restricted Diet Getting too much fat and cholesterol in your diet may cause health problems. Following this diet helps keep your fat and cholesterol at normal levels. This can keep you from getting sick. What types of fat should I choose?  Choose monosaturated and polyunsaturated fats. These are found in foods such as olive oil, canola oil, flaxseeds, walnuts, almonds, and seeds.  Eat more omega-3 fats. Good choices include salmon, mackerel, sardines, tuna, flaxseed oil, and ground flaxseeds.  Limit saturated fats. These are in animal products such as meats, butter,  and cream. They can also be in plant products such as palm oil, palm kernel oil, and coconut oil.  Avoid foods with partially hydrogenated oils in them. These contain trans fats. Examples of foods that have trans fats are stick margarine, some tub margarines, cookies, crackers, and other baked goods. What general guidelines do I need to follow?  Check food labels. Look for the words "trans fat" and "saturated fat."  When preparing a meal: ? Fill half of your plate with vegetables and green salads. ? Fill one fourth of your plate with whole grains. Look for the word "whole" as the first word in the ingredient list. ? Fill one fourth of your plate with lean protein foods.  Eat more foods that have fiber, like apples, carrots, beans, peas, and barley.  Eat more home-cooked foods. Eat less at restaurants and buffets.  Limit or avoid alcohol.  Limit foods high in starch and sugar.  Limit fried foods.  Cook foods without frying them. Baking, boiling, grilling, and broiling are all great options.  Lose weight if you are overweight. Losing even a small amount of weight can help your overall health. It can also help prevent diseases such as diabetes and heart disease. What foods can I eat? Grains Whole grains, such as whole wheat or whole grain breads, crackers, cereals, and pasta. Unsweetened oatmeal, bulgur, barley, quinoa, or brown rice. Corn or whole wheat flour tortillas. Vegetables Fresh or frozen vegetables (raw, steamed, roasted, or grilled). Green salads. Fruits All fresh, canned (in natural juice), or frozen fruits. Meat and Other Protein Products Ground beef (85% or leaner), grass-fed beef, or beef trimmed of fat. Skinless chicken or [place]. Ground chicken or [place]. Pork trimmed of fat. All fish and seafood. Eggs. Dried beans, peas, or lentils. Unsalted nuts or seeds. Unsalted canned or dry beans. Dairy Low-fat dairy products, such as skim or 1% milk, 2% or reduced-fat cheeses,  low-fat ricotta or cottage cheese, or plain low-fat yogurt. Fats and Oils Tub margarines without trans fats. Light or reduced-fat mayonnaise and salad dressings. Avocado. Olive, canola, sesame, or safflower oils. Natural peanut or almond butter (choose ones without added sugar and oil). The items listed above may not be a complete list of recommended foods or beverages. Contact your dietitian for more options. What foods are not recommended? Grains White bread. White pasta. White rice. Cornbread. Bagels, pastries, and croissants. Crackers that contain trans fat. Vegetables White potatoes. Corn. Creamed or fried vegetables. Vegetables in a cheese sauce. Fruits Dried fruits. Canned fruit in light or heavy syrup. Fruit juice. Meat and Other Protein Products Fatty cuts of meat. Ribs, chicken wings, bacon, sausage, bologna, salami, chitterlings, fatback, hot dogs, bratwurst, and packaged luncheon meats. Liver and organ meats. Dairy Whole or 2% milk, cream, half-and-half, and cream cheese. Whole milk cheeses. Whole-fat or sweetened yogurt. Full-fat cheeses. Nondairy creamers and whipped toppings. Processed cheese, cheese spreads, or cheese curds. Sweets and Desserts Corn syrup, sugars, honey, and molasses. Candy. Jam and jelly. Syrup. Sweetened cereals. Cookies, pies, cakes, donuts, muffins, and ice cream. Fats and Oils Butter, stick margarine, lard, shortening, ghee, or bacon fat. Coconut, palm kernel, or palm oils. Beverages Alcohol. Sweetened drinks (such as sodas, lemonade, and fruit drinks or punches). The items listed above may not be a complete list of foods and beverages to avoid. Contact your dietitian for more information. This information is not intended to replace advice given to  you by your health care provider. Make sure you discuss any questions you have with your health care provider. Document Released: 09/08/2011 Document Revised: 11/14/2015 Document Reviewed: 06/08/2013 Elsevier  Interactive Patient Education  [employer].

## 2017-03-04 ENCOUNTER — Other Ambulatory Visit (HOSPITAL_COMMUNITY)
Admission: RE | Admit: 2017-03-04 | Discharge: 2017-03-04 | Disposition: A | Discharge status: Home / Self Care | Payer: PPO | Source: Ambulatory Visit | Attending: Physician Assistant | Admitting: Physician Assistant

## 2017-03-04 DIAGNOSIS — Z1159 Encounter for screening for other viral diseases: Secondary | ICD-10-CM

## 2017-03-05 LAB — HEPATITIS PANEL, ACUTE
HCV Ab: <0.1 s/co ratio (ref 0.0–0.9)
HEP B C IGM: NEGATIVE
HEP B S AG: NEGATIVE
Hep A IgM: NEGATIVE

## 2017-03-07 ENCOUNTER — Other Ambulatory Visit: Payer: Self-pay

## 2017-03-07 ENCOUNTER — Emergency Department (HOSPITAL_COMMUNITY)
Admission: EM | Admit: 2017-03-07 | Discharge: 2017-03-07 | Disposition: A | Discharge status: Home / Self Care | Payer: PPO | Attending: Emergency Medicine | Admitting: Emergency Medicine

## 2017-03-07 ENCOUNTER — Encounter (HOSPITAL_COMMUNITY): Payer: Self-pay | Admitting: Emergency Medicine

## 2017-03-07 DIAGNOSIS — E890 Postprocedural hypothyroidism: Secondary | ICD-10-CM | POA: Insufficient documentation

## 2017-03-07 DIAGNOSIS — F17210 Nicotine dependence, cigarettes, uncomplicated: Secondary | ICD-10-CM | POA: Insufficient documentation

## 2017-03-07 DIAGNOSIS — B029 Zoster without complications: Secondary | ICD-10-CM | POA: Insufficient documentation

## 2017-03-07 DIAGNOSIS — Z79899 Other long term (current) drug therapy: Secondary | ICD-10-CM | POA: Insufficient documentation

## 2017-03-07 DIAGNOSIS — I10 Essential (primary) hypertension: Secondary | ICD-10-CM | POA: Insufficient documentation

## 2017-03-07 MED ORDER — ACYCLOVIR 800 MG PO TABS: 800.0000 mg | ORAL_TABLET | Freq: Every day | ORAL | 0 refills | Status: DC

## 2017-03-07 NOTE — Discharge Instructions (Signed)
Follow-up with your primary provider for recheck.  You may apply 1% hydrocortisone cream 3 times a day if needed for itching

## 2017-03-07 NOTE — ED Triage Notes (Signed)
Patient c/o blister to right flank. Per patient red area/?bug bite noted last night, applied triple antibiotic ointment and bandage, woke with blister. Denies any fevers or drainage.

## 2017-03-09 NOTE — ED Provider Notes (Signed)
[facility] EMERGENCY DEPARTMENT Provider Note   CSN: [id number] Arrival date & time: 03/07/17  1232     History   Chief Complaint Chief Complaint  Patient presents with  . Blister    HPI Donald Chambers is a 51 y.o. male.  HPI  Donald Chambers is a 51 y.o. male who presents to the Emergency Department complaining of burning, pain, and itching to the right flank.  States he noticed redness to the same area one day prior to arrival.  He woke up with a large blister to the affected area.  He denies any known injury fever, chills.  Does admit to a history of chickenpox as a child.  Past Medical History:  Diagnosis Date  . GERD (gastroesophageal reflux disease)   . Hypercholesteremia   . Hypertension   . Hyperthyroidism    s/p RAI    Patient Active Problem List   Diagnosis Date Noted  . Gastroesophageal reflux disease 03/03/2017  . Cigarette nicotine dependence with nicotine-induced disorder 03/03/2017  . Obesity 03/03/2017  . Unable to read or write 03/03/2017  . Hypothyroidism following radioiodine therapy 05/27/2015  . Vitamin D deficiency 05/27/2015  . Hyperlipidemia 05/27/2015  . Essential hypertension, benign 05/27/2015  . Pre-diabetes 05/27/2015    History reviewed. No pertinent surgical history.     Home Medications    Prior to Admission medications   Medication Sig Start Date End Date Taking? Authorizing Provider  acyclovir (ZOVIRAX) 800 MG tablet Take 1 tablet (800 mg total) by mouth 5 (five) times daily. 03/07/17   Timoteo Carreiro, PA-C  atorvastatin (LIPITOR) 20 MG tablet Take 1 tablet (20 mg total) by mouth daily. 03/03/17   Soyla Dryer, PA-C  benzonatate (TESSALON) 100 MG capsule 1-2 po q 8 hour prn cough 02/02/17   Soyla Dryer, PA-C  cetirizine (ZYRTEC) 10 MG tablet Take 10 mg daily by mouth.    [provider]  Cholecalciferol (VITAMIN D3) 5000 units CAPS Take 1 capsule (5,000 Units total) by mouth daily. 11/27/15   Cassandria Anger, MD  levothyroxine (SYNTHROID, LEVOTHROID) 137 MCG tablet Take 1 tablet (137 mcg total) by mouth daily before breakfast. 03/03/17   Soyla Dryer, PA-C  lisinopril-hydrochlorothiazide (PRINZIDE,ZESTORETIC) 10-12.5 MG tablet Take 1 tablet by mouth daily. 03/03/17   Soyla Dryer, PA-C  omeprazole (PRILOSEC) 20 MG capsule Take 2 capsules (40 mg total) by mouth daily. 03/03/17   Soyla Dryer, PA-C  simvastatin (ZOCOR) 40 MG tablet Take 40 mg daily by mouth.    [provider]    Family History Family History  Problem Relation Age of Onset  . Diabetes Father   . Hypertension Father   . Hypertension Mother     Social History Social History   Tobacco Use  . Smoking status: Current Every Day Smoker    Packs/day: 1.00    Years: 34.00    Pack years: 34.00    Types: Cigarettes  . Smokeless tobacco: Never Used  Substance Use Topics  . Alcohol use: No    Alcohol/week: 0.0 oz  . Drug use: No     Allergies   Patient has no known allergies.   Review of Systems Review of Systems  Constitutional: Negative for activity change, appetite change, chills and fever.  HENT: Negative for facial swelling, sore throat and trouble swallowing.   Respiratory: Negative for chest tightness, shortness of breath and wheezing.   Musculoskeletal: Negative for arthralgias, myalgias, neck pain and neck stiffness.  Skin: Positive for color  change and rash. Negative for wound.  Neurological: Negative for dizziness, weakness, numbness and headaches.  All other systems reviewed and are negative.    Physical Exam Updated Vital Signs BP (!) 148/89   Pulse 85   Temp 98 F (36.7 C) (Oral)   Resp 16   Ht 5\' 10"  (1.778 m)   Wt 112.5 kg (248 lb)   SpO2 98%   BMI 35.58 kg/m   Physical Exam  Constitutional: He is oriented to person, place, and time. He appears well-developed and well-nourished. No distress.  HENT:  Head: Normocephalic and atraumatic.  Mouth/Throat:  Oropharynx is clear and moist.  Neck: Normal range of motion. Neck supple.  Cardiovascular: Normal rate, regular rhythm and intact distal pulses.  No murmur heard. Pulmonary/Chest: Effort normal and breath sounds normal. No respiratory distress.  Musculoskeletal: Normal range of motion. He exhibits no edema or tenderness.  Lymphadenopathy:    He has no cervical adenopathy.  Neurological: He is alert and oriented to person, place, and time. He exhibits normal muscle tone. Coordination normal.  Skin: Skin is warm. Rash noted. There is erythema.  oblong slightly raised erythematous plaque to the right flank with 1 cm vesicle and two smaller, pinpoint satellite vesicles.  Nursing note and vitals reviewed.    ED Treatments / Results  Labs (all labs ordered are listed, but only abnormal results are displayed) Labs Reviewed - No data to display  EKG  EKG Interpretation None       Radiology No results found.  Procedures Procedures (including critical care time)  Medications Ordered in ED Medications - No data to display   Initial Impression / Assessment and Plan / ED Course  I have reviewed the triage vital signs and the nursing notes.  Pertinent labs & imaging results that were available during my care of the patient were reviewed by me and considered in my medical decision making (see chart for details).     Patient well-appearing.  afebrile.  Rash is concerning for herpes zoster.  Also possible insect bite, but felt to be less likely.  He agrees to over-the-counter Benadryl if needed for itching and he agrees to follow-up with PCP.  Return precautions discussed  Final Clinical Impressions(s) / ED Diagnoses   Final diagnoses:  Herpes zoster without complication    ED Discharge Orders        Ordered    acyclovir (ZOVIRAX) 800 MG tablet  5 times daily     03/07/17 1408       Kem Parkinson, PA-C 03/09/17 2130    Julianne Rice, MD 03/10/17 [phone]

## 2017-06-02 ENCOUNTER — Encounter: Payer: Self-pay | Admitting: Physician Assistant

## 2017-06-02 ENCOUNTER — Ambulatory Visit: Payer: Self-pay | Admitting: Physician Assistant

## 2017-06-02 VITALS — BP 122/73 | HR 74 | Ht 71.0 in | Wt 249.5 lb

## 2017-06-02 DIAGNOSIS — E669 Obesity, unspecified: Secondary | ICD-10-CM

## 2017-06-02 DIAGNOSIS — K219 Gastro-esophageal reflux disease without esophagitis: Secondary | ICD-10-CM

## 2017-06-02 DIAGNOSIS — E890 Postprocedural hypothyroidism: Secondary | ICD-10-CM

## 2017-06-02 DIAGNOSIS — E782 Mixed hyperlipidemia: Secondary | ICD-10-CM

## 2017-06-02 DIAGNOSIS — I10 Essential (primary) hypertension: Secondary | ICD-10-CM

## 2017-06-02 DIAGNOSIS — F17219 Nicotine dependence, cigarettes, with unspecified nicotine-induced disorders: Secondary | ICD-10-CM

## 2017-06-02 DIAGNOSIS — Z550 Illiteracy and low-level literacy: Secondary | ICD-10-CM

## 2017-06-02 NOTE — Progress Notes (Signed)
BP 122/73 (BP Location: Right Arm, Patient Position: Sitting, Cuff Size: Large)   Pulse 74   Ht 5\' 11"  (1.803 m)   Wt 249 lb 8 oz (113.2 kg)   SpO2 96%   BMI 34.80 kg/m    Subjective:    Patient ID: Donald Chambers, male    DOB: 1965-07-12, 52 y.o.   MRN: [id number]  HPI: Donald Chambers is a 52 y.o. male presenting on 06/02/2017 for Hyperlipidemia and Hypertension   HPI   Pt is doing well with no complaints  Relevant past medical, surgical, family and social history reviewed and updated as indicated. Interim medical history since our last visit reviewed. Allergies and medications reviewed and updated.   Current Outpatient Medications:  .  atorvastatin (LIPITOR) 20 MG tablet, Take 1 tablet (20 mg total) by mouth daily., Disp: 90 tablet, Rfl: 1 .  cetirizine (ZYRTEC) 10 MG tablet, Take 10 mg daily by mouth., Disp: , Rfl:  .  Cholecalciferol (VITAMIN D3) 5000 units CAPS, Take 1 capsule (5,000 Units total) by mouth daily., Disp: 90 capsule, Rfl: 0 .  levothyroxine (SYNTHROID, LEVOTHROID) 137 MCG tablet, Take 1 tablet (137 mcg total) by mouth daily before breakfast., Disp: 90 tablet, Rfl: 1 .  lisinopril-hydrochlorothiazide (PRINZIDE,ZESTORETIC) 10-12.5 MG tablet, Take 1 tablet by mouth daily., Disp: 90 tablet, Rfl: 1 .  omeprazole (PRILOSEC) 20 MG capsule, Take 2 capsules (40 mg total) by mouth daily., Disp: 180 capsule, Rfl: 1   Review of Systems  Constitutional: Negative for appetite change, chills, diaphoresis, fatigue, fever and unexpected weight change.  HENT: Negative for congestion, dental problem, drooling, ear pain, facial swelling, hearing loss, mouth sores, sneezing, sore throat, trouble swallowing and voice change.   Eyes: Negative for pain, discharge, redness, itching and visual disturbance.  Respiratory: Negative for cough, choking, shortness of breath and wheezing.   Cardiovascular: Negative for chest pain, palpitations and leg swelling.  Gastrointestinal:  Negative for abdominal pain, blood in stool, constipation, diarrhea and vomiting.  Endocrine: Positive for heat intolerance. Negative for cold intolerance and polydipsia.  Genitourinary: Negative for decreased urine volume, dysuria and hematuria.  Musculoskeletal: Negative for arthralgias, back pain and gait problem.  Skin: Negative for rash.  Allergic/Immunologic: Negative for environmental allergies.  Neurological: Negative for seizures, syncope, light-headedness and headaches.  Hematological: Negative for adenopathy.  Psychiatric/Behavioral: Negative for agitation, dysphoric mood and suicidal ideas. The patient is not nervous/anxious.     Per HPI unless specifically indicated above     Objective:    BP 122/73 (BP Location: Right Arm, Patient Position: Sitting, Cuff Size: Large)   Pulse 74   Ht 5\' 11"  (1.803 m)   Wt 249 lb 8 oz (113.2 kg)   SpO2 96%   BMI 34.80 kg/m   Wt Readings from Last 3 Encounters:  06/02/17 249 lb 8 oz (113.2 kg)  03/07/17 248 lb (112.5 kg)  03/03/17 248 lb 8 oz (112.7 kg)    Physical Exam  Constitutional: He is oriented to person, place, and time. He appears well-developed and well-nourished.  HENT:  Head: Normocephalic and atraumatic.  Neck: Neck supple.  Cardiovascular: Normal rate and regular rhythm.  Pulmonary/Chest: Effort normal and breath sounds normal. He has no wheezes.  Abdominal: Soft. Bowel sounds are normal. There is no hepatosplenomegaly. There is no tenderness.  Musculoskeletal: He exhibits no edema.  Lymphadenopathy:    He has no cervical adenopathy.  Neurological: He is alert and oriented to person, place, and time.  Skin: Skin  is warm and dry.  Psychiatric: He has a normal mood and affect. His behavior is normal.  Vitals reviewed.       Assessment & Plan:   Encounter Diagnoses  Name Primary?  . Essential hypertension, benign Yes  . Mixed hyperlipidemia   . Hypothyroidism following radioiodine therapy   . Obesity,  unspecified classification, unspecified obesity type, unspecified whether serious comorbidity present   . Cigarette nicotine dependence with nicotine-induced disorder   . Gastroesophageal reflux disease, esophagitis presence not specified   . Unable to read or write      -Check fasting labs tomorrow- will call with results -pt to continue current medcations -pt to follow up 3 months.  RTO sooner prn

## 2017-06-03 ENCOUNTER — Other Ambulatory Visit (HOSPITAL_COMMUNITY)
Admission: RE | Admit: 2017-06-03 | Discharge: 2017-06-03 | Disposition: A | Discharge status: Home / Self Care | Payer: PPO | Source: Ambulatory Visit | Attending: Physician Assistant | Admitting: Physician Assistant

## 2017-06-03 DIAGNOSIS — E890 Postprocedural hypothyroidism: Secondary | ICD-10-CM | POA: Insufficient documentation

## 2017-06-03 DIAGNOSIS — I10 Essential (primary) hypertension: Secondary | ICD-10-CM | POA: Insufficient documentation

## 2017-06-03 DIAGNOSIS — E782 Mixed hyperlipidemia: Secondary | ICD-10-CM | POA: Insufficient documentation

## 2017-06-03 LAB — COMPREHENSIVE METABOLIC PANEL
ALBUMIN: 4.2 g/dL (ref 3.5–5.0)
ALT: 48 U/L (ref 17–63)
ANION GAP: 11 (ref 5–15)
AST: 25 U/L (ref 15–41)
Alkaline Phosphatase: 75 U/L (ref 38–126)
BUN: 16 mg/dL (ref 6–20)
CALCIUM: 9.7 mg/dL (ref 8.9–10.3)
CO2: 27 mmol/L (ref 22–32)
Chloride: 101 mmol/L (ref 101–111)
Creatinine, Ser: 1.11 mg/dL (ref 0.61–1.24)
GFR calc non Af Amer: >60 mL/min (ref >60)
GLUCOSE: 115 mg/dL — AB (ref 65–99)
POTASSIUM: 3.9 mmol/L (ref 3.5–5.1)
Sodium: 139 mmol/L (ref 135–145)
Total Bilirubin: 0.9 mg/dL (ref 0.3–1.2)
Total Protein: 7.3 g/dL (ref 6.5–8.1)

## 2017-06-03 LAB — LIPID PANEL
CHOLESTEROL: 150 mg/dL (ref 0–200)
HDL: 30 mg/dL — AB (ref >40)
LDL Cholesterol: 87 mg/dL (ref 0–99)
TRIGLYCERIDES: 166 mg/dL — AB (ref <150)
Total CHOL/HDL Ratio: 5 RATIO
VLDL: 33 mg/dL (ref 0–40)

## 2017-06-03 LAB — TSH: TSH: 2.381 u[IU]/mL (ref 0.350–4.500)

## 2017-09-02 ENCOUNTER — Ambulatory Visit: Payer: Self-pay | Admitting: Physician Assistant

## 2017-09-02 ENCOUNTER — Encounter: Payer: Self-pay | Admitting: Physician Assistant

## 2017-09-02 VITALS — BP 138/85 | HR 75 | Temp 98.1°F | Ht 71.0 in | Wt 246.5 lb

## 2017-09-02 DIAGNOSIS — I10 Essential (primary) hypertension: Secondary | ICD-10-CM

## 2017-09-02 DIAGNOSIS — E782 Mixed hyperlipidemia: Secondary | ICD-10-CM

## 2017-09-02 DIAGNOSIS — K219 Gastro-esophageal reflux disease without esophagitis: Secondary | ICD-10-CM

## 2017-09-02 DIAGNOSIS — E669 Obesity, unspecified: Secondary | ICD-10-CM

## 2017-09-02 DIAGNOSIS — F17219 Nicotine dependence, cigarettes, with unspecified nicotine-induced disorders: Secondary | ICD-10-CM

## 2017-09-02 DIAGNOSIS — E890 Postprocedural hypothyroidism: Secondary | ICD-10-CM

## 2017-09-02 MED ORDER — LEVOTHYROXINE SODIUM 137 MCG PO TABS: 137.0000 ug | ORAL_TABLET | Freq: Every day | ORAL | 1 refills | Status: DC

## 2017-09-02 MED ORDER — LISINOPRIL-HYDROCHLOROTHIAZIDE 10-12.5 MG PO TABS: 1.0000 | ORAL_TABLET | Freq: Every day | ORAL | 1 refills | Status: DC

## 2017-09-02 MED ORDER — ATORVASTATIN CALCIUM 20 MG PO TABS: 20.0000 mg | ORAL_TABLET | Freq: Every day | ORAL | 1 refills | Status: DC

## 2017-09-02 MED ORDER — OMEPRAZOLE 40 MG PO CPDR: 40.0000 mg | DELAYED_RELEASE_CAPSULE | Freq: Every day | ORAL | 2 refills | Status: DC

## 2017-09-02 NOTE — Patient Instructions (Signed)
Steps to Quit Smoking Smoking tobacco can be harmful to your health and can affect almost every organ in your body. Smoking puts you, and those around you, at risk for developing many serious chronic diseases. Quitting smoking is difficult, but it is one of the best things that you can do for your health. It is never too late to quit. What are the benefits of quitting smoking? When you quit smoking, you lower your risk of developing serious diseases and conditions, such as:  Lung cancer or lung disease, such as COPD.  Heart disease.  Stroke.  Heart attack.  Infertility.  Osteoporosis and bone fractures.  Additionally, symptoms such as coughing, wheezing, and shortness of breath may get better when you quit. You may also find that you get sick less often because your body is stronger at fighting off colds and infections. If you are pregnant, quitting smoking can help to reduce your chances of having a baby of low birth weight. How do I get ready to quit? When you decide to quit smoking, create a plan to make sure that you are successful. Before you quit:  Pick a date to quit. Set a date within the next two weeks to give you time to prepare.  Write down the reasons why you are quitting. Keep this list in places where you will see it often, such as on your bathroom mirror or in your car or wallet.  Identify the people, places, things, and activities that make you want to smoke (triggers) and avoid them. Make sure to take these actions: ? Throw away all cigarettes at home, at work, and in your car. ? Throw away smoking accessories, such as ashtrays and lighters. ? Clean your car and make sure to empty the ashtray. ? Clean your home, including curtains and carpets.  Tell your family, friends, and coworkers that you are quitting. Support from your loved ones can make quitting easier.  Talk with your health care provider about your options for quitting smoking.  Find out what treatment  options are covered by your health insurance.  What strategies can I use to quit smoking? Talk with your healthcare provider about different strategies to quit smoking. Some strategies include:  Quitting smoking altogether instead of gradually lessening how much you smoke over a period of time. Research shows that quitting "cold turkey" is more successful than gradually quitting.  Attending in-person counseling to help you build problem-solving skills. You are more likely to have success in quitting if you attend several counseling sessions. Even short sessions of 10 minutes can be effective.  Finding resources and support systems that can help you to quit smoking and remain smoke-free after you quit. These resources are most helpful when you use them often. They can include: ? Online chats with a counselor. ? Telephone quitlines. ? Printed self-help materials. ? Support groups or group counseling. ? Text messaging programs. ? Mobile phone applications.  Taking medicines to help you quit smoking. (If you are pregnant or breastfeeding, talk with your health care provider first.) Some medicines contain nicotine and some do not. Both types of medicines help with cravings, but the medicines that include nicotine help to relieve withdrawal symptoms. Your health care provider may recommend: ? Nicotine patches, gum, or lozenges. ? Nicotine inhalers or sprays. ? Non-nicotine medicine that is taken by mouth.  Talk with your health care provider about combining strategies, such as taking medicines while you are also receiving in-person counseling. Using these two strategies together   makes you more likely to succeed in quitting than if you used either strategy on its own. If you are pregnant or breastfeeding, talk with your health care provider about finding counseling or other support strategies to quit smoking. Do not take medicine to help you quit smoking unless told to do so by your health care  provider. What things can I do to make it easier to quit? Quitting smoking might feel overwhelming at first, but there is a lot that you can do to make it easier. Take these important actions:  Reach out to your family and friends and ask that they support and encourage you during this time. Call telephone quitlines, reach out to support groups, or work with a counselor for support.  Ask people who smoke to avoid smoking around you.  Avoid places that trigger you to smoke, such as bars, parties, or smoke-break areas at work.  Spend time around people who do not smoke.  Lessen stress in your life, because stress can be a smoking trigger for some people. To lessen stress, try: ? Exercising regularly. ? Deep-breathing exercises. ? Yoga. ? Meditating. ? Performing a body scan. This involves closing your eyes, scanning your body from head to toe, and noticing which parts of your body are particularly tense. Purposefully relax the muscles in those areas.  Download or purchase mobile phone or tablet apps (applications) that can help you stick to your quit plan by providing reminders, tips, and encouragement. There are many free apps, such as QuitGuide from the CDC (Centers for Disease Control and Prevention). You can find other support for quitting smoking (smoking cessation) through smokefree.gov and other websites.  How will I feel when I quit smoking? Within the first 24 hours of quitting smoking, you may start to feel some withdrawal symptoms. These symptoms are usually most noticeable 2-3 days after quitting, but they usually do not last beyond 2-3 weeks. Changes or symptoms that you might experience include:  Mood swings.  Restlessness, anxiety, or irritation.  Difficulty concentrating.  Dizziness.  Strong cravings for sugary foods in addition to nicotine.  Mild weight gain.  Constipation.  Nausea.  Coughing or a sore throat.  Changes in how your medicines work in your  body.  A depressed mood.  Difficulty sleeping (insomnia).  After the first 2-3 weeks of quitting, you may start to notice more positive results, such as:  Improved sense of smell and taste.  Decreased coughing and sore throat.  Slower heart rate.  Lower blood pressure.  Clearer skin.  The ability to breathe more easily.  Fewer sick days.  Quitting smoking is very challenging for most people. Do not get discouraged if you are not successful the first time. Some people need to make many attempts to quit before they achieve long-term success. Do your best to stick to your quit plan, and talk with your health care provider if you have any questions or concerns. This information is not intended to replace advice given to you by your health care provider. Make sure you discuss any questions you have with your health care provider. Document Released: 03/03/2001 Document Revised: 11/05/2015 Document Reviewed: 07/24/2014 Elsevier Interactive Patient Education  2018 [employer]  

## 2017-09-02 NOTE — Progress Notes (Signed)
BP 138/85   Pulse 75   Temp 98.1 F (36.7 C)   Ht 5\' 11"  (1.803 m)   Wt 246 lb 8 oz (111.8 kg)   SpO2 93%   BMI 34.38 kg/m    Subjective:    Patient ID: Donald Chambers, male    DOB: 27-Sep-1965, 52 y.o.   MRN: [id number]  HPI: Donald Chambers is a 52 y.o. male presenting on 09/02/2017 for Hypertension; Hyperlipidemia; and Thyroid Problem   HPI    pt says he is doing well and he has no complaints.    pt still smoking  Relevant past medical, surgical, family and social history reviewed and updated as indicated. Interim medical history since our last visit reviewed. Allergies and medications reviewed and updated.   Current Outpatient Medications:  .  atorvastatin (LIPITOR) 20 MG tablet, Take 1 tablet (20 mg total) by mouth daily., Disp: 90 tablet, Rfl: 1 .  cetirizine (ZYRTEC) 10 MG tablet, Take 10 mg daily by mouth., Disp: , Rfl:  .  Cholecalciferol (VITAMIN D3) 5000 units CAPS, Take 1 capsule (5,000 Units total) by mouth daily., Disp: 90 capsule, Rfl: 0 .  levothyroxine (SYNTHROID, LEVOTHROID) 137 MCG tablet, Take 1 tablet (137 mcg total) by mouth daily before breakfast., Disp: 90 tablet, Rfl: 1 .  lisinopril-hydrochlorothiazide (PRINZIDE,ZESTORETIC) 10-12.5 MG tablet, Take 1 tablet by mouth daily., Disp: 90 tablet, Rfl: 1 .  omeprazole (PRILOSEC) 40 MG capsule, Take 40 mg by mouth daily., Disp: , Rfl:    Review of Systems  Constitutional: Negative for appetite change, chills, diaphoresis, fatigue, fever and unexpected weight change.  HENT: Negative for congestion, dental problem, drooling, ear pain, facial swelling, hearing loss, mouth sores, sneezing, sore throat, trouble swallowing and voice change.   Eyes: Negative for pain, discharge, redness, itching and visual disturbance.  Respiratory: Negative for cough, choking, shortness of breath and wheezing.   Cardiovascular: Negative for chest pain, palpitations and leg swelling.  Gastrointestinal: Negative for abdominal  pain, blood in stool, constipation, diarrhea and vomiting.  Endocrine: Negative for cold intolerance, heat intolerance and polydipsia.  Genitourinary: Negative for decreased urine volume, dysuria and hematuria.  Musculoskeletal: Negative for arthralgias, back pain and gait problem.  Skin: Negative for rash.  Allergic/Immunologic: Negative for environmental allergies.  Neurological: Negative for seizures, syncope, light-headedness and headaches.  Hematological: Negative for adenopathy.  Psychiatric/Behavioral: Negative for agitation, dysphoric mood and suicidal ideas. The patient is not nervous/anxious.     Per HPI unless specifically indicated above     Objective:    BP 138/85   Pulse 75   Temp 98.1 F (36.7 C)   Ht 5\' 11"  (1.803 m)   Wt 246 lb 8 oz (111.8 kg)   SpO2 93%   BMI 34.38 kg/m   Wt Readings from Last 3 Encounters:  09/02/17 246 lb 8 oz (111.8 kg)  06/02/17 249 lb 8 oz (113.2 kg)  03/07/17 248 lb (112.5 kg)    Physical Exam  Constitutional: He is oriented to person, place, and time. He appears well-developed and well-nourished.  HENT:  Head: Normocephalic and atraumatic.  Neck: Neck supple.  Cardiovascular: Normal rate and regular rhythm.  Pulmonary/Chest: Effort normal and breath sounds normal. He has no wheezes.  Abdominal: Soft. Bowel sounds are normal. There is no hepatosplenomegaly. There is no tenderness.  Musculoskeletal: He exhibits no edema.  Lymphadenopathy:    He has no cervical adenopathy.  Neurological: He is alert and oriented to person, place, and time.  Skin:  Skin is warm and dry.  Psychiatric: He has a normal mood and affect. His behavior is normal.  Vitals reviewed.       Assessment & Plan:   Encounter Diagnoses  Name Primary?  . Essential hypertension, benign Yes  . Mixed hyperlipidemia   . Hypothyroidism following radioiodine therapy   . Gastroesophageal reflux disease, esophagitis presence not specified   . Obesity, unspecified  classification, unspecified obesity type, unspecified whether serious comorbidity present   . Cigarette nicotine dependence with nicotine-induced disorder     -pt to Get fasting labs.  Will call with results -counseled smoking cessation -pt to Follow up 3 months.  RTO sooner prn

## 2017-09-06 ENCOUNTER — Other Ambulatory Visit (HOSPITAL_COMMUNITY)
Admission: RE | Admit: 2017-09-06 | Discharge: 2017-09-06 | Disposition: A | Discharge status: Home / Self Care | Payer: Medicare Other | Source: Ambulatory Visit | Attending: Physician Assistant | Admitting: Physician Assistant

## 2017-09-06 ENCOUNTER — Other Ambulatory Visit: Payer: Self-pay | Admitting: Physician Assistant

## 2017-09-06 DIAGNOSIS — I10 Essential (primary) hypertension: Secondary | ICD-10-CM

## 2017-09-06 DIAGNOSIS — E782 Mixed hyperlipidemia: Secondary | ICD-10-CM

## 2017-09-06 LAB — COMPREHENSIVE METABOLIC PANEL
ALBUMIN: 4.2 g/dL (ref 3.5–5.0)
ALT: 35 U/L (ref 17–63)
AST: 19 U/L (ref 15–41)
Alkaline Phosphatase: 74 U/L (ref 38–126)
Anion gap: 8 (ref 5–15)
BUN: 17 mg/dL (ref 6–20)
CHLORIDE: 105 mmol/L (ref 101–111)
CO2: 30 mmol/L (ref 22–32)
Calcium: 9.7 mg/dL (ref 8.9–10.3)
Creatinine, Ser: 1.08 mg/dL (ref 0.61–1.24)
GFR calc Af Amer: >60 mL/min (ref >60)
GFR calc non Af Amer: >60 mL/min (ref >60)
GLUCOSE: 114 mg/dL — AB (ref 65–99)
POTASSIUM: 4.2 mmol/L (ref 3.5–5.1)
Sodium: 143 mmol/L (ref 135–145)
Total Bilirubin: 0.8 mg/dL (ref 0.3–1.2)
Total Protein: 7.2 g/dL (ref 6.5–8.1)

## 2017-09-06 LAB — LIPID PANEL
CHOL/HDL RATIO: 5.9 ratio
CHOLESTEROL: 166 mg/dL (ref 0–200)
HDL: 28 mg/dL — ABNORMAL LOW (ref >40)
LDL CALC: 96 mg/dL (ref 0–99)
Triglycerides: 210 mg/dL — ABNORMAL HIGH (ref <150)
VLDL: 42 mg/dL — AB (ref 0–40)

## 2017-11-25 ENCOUNTER — Other Ambulatory Visit (HOSPITAL_COMMUNITY)
Admission: RE | Admit: 2017-11-25 | Discharge: 2017-11-25 | Disposition: A | Discharge status: Home / Self Care | Payer: Self-pay | Source: Ambulatory Visit | Attending: Physician Assistant | Admitting: Physician Assistant

## 2017-11-25 DIAGNOSIS — E782 Mixed hyperlipidemia: Secondary | ICD-10-CM | POA: Insufficient documentation

## 2017-11-25 DIAGNOSIS — I10 Essential (primary) hypertension: Secondary | ICD-10-CM | POA: Insufficient documentation

## 2017-11-25 LAB — COMPREHENSIVE METABOLIC PANEL
ALT: 97 U/L — ABNORMAL HIGH (ref 0–44)
ANION GAP: 11 (ref 5–15)
AST: 42 U/L — ABNORMAL HIGH (ref 15–41)
Albumin: 4.2 g/dL (ref 3.5–5.0)
Alkaline Phosphatase: 83 U/L (ref 38–126)
BILIRUBIN TOTAL: 1.5 mg/dL — AB (ref 0.3–1.2)
BUN: 16 mg/dL (ref 6–20)
CO2: 25 mmol/L (ref 22–32)
Calcium: 9.5 mg/dL (ref 8.9–10.3)
Chloride: 105 mmol/L (ref 98–111)
Creatinine, Ser: 1.14 mg/dL (ref 0.61–1.24)
GFR calc Af Amer: >60 mL/min (ref >60)
Glucose, Bld: 120 mg/dL — ABNORMAL HIGH (ref 70–99)
POTASSIUM: 5 mmol/L (ref 3.5–5.1)
Sodium: 141 mmol/L (ref 135–145)
TOTAL PROTEIN: 7.5 g/dL (ref 6.5–8.1)

## 2017-11-25 LAB — LIPID PANEL
CHOL/HDL RATIO: 5.9 ratio
CHOLESTEROL: 196 mg/dL (ref 0–200)
HDL: 33 mg/dL — AB (ref >40)
LDL Cholesterol: 124 mg/dL — ABNORMAL HIGH (ref 0–99)
TRIGLYCERIDES: 195 mg/dL — AB (ref <150)
VLDL: 39 mg/dL (ref 0–40)

## 2017-12-02 ENCOUNTER — Other Ambulatory Visit: Payer: Self-pay | Admitting: Physician Assistant

## 2017-12-02 ENCOUNTER — Encounter: Payer: Self-pay | Admitting: Physician Assistant

## 2017-12-02 ENCOUNTER — Ambulatory Visit: Payer: Self-pay | Admitting: Physician Assistant

## 2017-12-02 VITALS — BP 124/79 | HR 65 | Temp 97.7°F | Ht 71.0 in | Wt 251.0 lb

## 2017-12-02 DIAGNOSIS — Z550 Illiteracy and low-level literacy: Secondary | ICD-10-CM

## 2017-12-02 DIAGNOSIS — Z1211 Encounter for screening for malignant neoplasm of colon: Secondary | ICD-10-CM

## 2017-12-02 DIAGNOSIS — R945 Abnormal results of liver function studies: Secondary | ICD-10-CM

## 2017-12-02 DIAGNOSIS — K219 Gastro-esophageal reflux disease without esophagitis: Secondary | ICD-10-CM

## 2017-12-02 DIAGNOSIS — E669 Obesity, unspecified: Secondary | ICD-10-CM

## 2017-12-02 DIAGNOSIS — E782 Mixed hyperlipidemia: Secondary | ICD-10-CM

## 2017-12-02 DIAGNOSIS — R7989 Other specified abnormal findings of blood chemistry: Secondary | ICD-10-CM

## 2017-12-02 DIAGNOSIS — R7303 Prediabetes: Secondary | ICD-10-CM

## 2017-12-02 DIAGNOSIS — Z125 Encounter for screening for malignant neoplasm of prostate: Secondary | ICD-10-CM

## 2017-12-02 DIAGNOSIS — E890 Postprocedural hypothyroidism: Secondary | ICD-10-CM

## 2017-12-02 DIAGNOSIS — I10 Essential (primary) hypertension: Secondary | ICD-10-CM

## 2017-12-02 DIAGNOSIS — F17200 Nicotine dependence, unspecified, uncomplicated: Secondary | ICD-10-CM

## 2017-12-02 LAB — IFOBT (OCCULT BLOOD): IFOBT: NEGATIVE

## 2017-12-02 NOTE — Patient Instructions (Signed)

## 2017-12-02 NOTE — Progress Notes (Signed)
BP 124/79 (BP Location: Right Arm, Patient Position: Sitting, Cuff Size: Normal)   Pulse 65   Temp 97.7 F (36.5 C)   Ht 5\' 11"  (1.803 m)   Wt 251 lb (113.9 kg)   SpO2 96%   BMI 35.01 kg/m    Subjective:    Patient ID: Donald Chambers, male    DOB: Jul 20, 1965, 52 y.o.   MRN: [id number]  HPI: Donald Chambers is a 52 y.o. male presenting on 12/02/2017 for Hypertension and Hyperlipidemia   HPI   Pt had quit smoking for 3 week but he gained a lot of weight so he started back smoking.  He is without other complaint today.   Relevant past medical, surgical, family and social history reviewed and updated as indicated. Interim medical history since our last visit reviewed. Allergies and medications reviewed and updated.   Current Outpatient Medications:  .  atorvastatin (LIPITOR) 20 MG tablet, Take 1 tablet (20 mg total) by mouth daily., Disp: 90 tablet, Rfl: 1 .  Cholecalciferol (VITAMIN D3) 5000 units CAPS, Take 1 capsule (5,000 Units total) by mouth daily. (Patient taking differently: Take 4,000 Units by mouth daily. ), Disp: 90 capsule, Rfl: 0 .  levothyroxine (SYNTHROID, LEVOTHROID) 137 MCG tablet, Take 1 tablet (137 mcg total) by mouth daily before breakfast., Disp: 90 tablet, Rfl: 1 .  lisinopril-hydrochlorothiazide (PRINZIDE,ZESTORETIC) 10-12.5 MG tablet, Take 1 tablet by mouth daily., Disp: 90 tablet, Rfl: 1 .  loratadine (CLARITIN) 10 MG tablet, Take 10 mg by mouth daily., Disp: , Rfl:  .  Omega-3 Fatty Acids (FISH OIL) 1000 MG CAPS, Take 1 capsule by mouth daily., Disp: , Rfl:  .  omeprazole (PRILOSEC) 40 MG capsule, Take 1 capsule (40 mg total) by mouth daily., Disp: 90 capsule, Rfl: 2  Review of Systems  Constitutional: Positive for diaphoresis. Negative for appetite change, chills, fatigue, fever and unexpected weight change.  HENT: Negative for congestion, dental problem, drooling, ear pain, facial swelling, hearing loss, mouth sores, sneezing, sore throat, trouble  swallowing and voice change.   Eyes: Negative for pain, discharge, redness, itching and visual disturbance.  Respiratory: Negative for cough, choking, shortness of breath and wheezing.   Cardiovascular: Negative for chest pain, palpitations and leg swelling.  Gastrointestinal: Negative for abdominal pain, blood in stool, constipation, diarrhea and vomiting.  Endocrine: Positive for heat intolerance. Negative for cold intolerance and polydipsia.  Genitourinary: Negative for decreased urine volume, dysuria and hematuria.  Musculoskeletal: Negative for arthralgias, back pain and gait problem.  Skin: Negative for rash.  Allergic/Immunologic: Negative for environmental allergies.  Neurological: Negative for seizures, syncope, light-headedness and headaches.  Hematological: Negative for adenopathy.  Psychiatric/Behavioral: Negative for agitation, dysphoric mood and suicidal ideas. The patient is not nervous/anxious.     Per HPI unless specifically indicated above     Objective:    BP 124/79 (BP Location: Right Arm, Patient Position: Sitting, Cuff Size: Normal)   Pulse 65   Temp 97.7 F (36.5 C)   Ht 5\' 11"  (1.803 m)   Wt 251 lb (113.9 kg)   SpO2 96%   BMI 35.01 kg/m   Wt Readings from Last 3 Encounters:  12/02/17 251 lb (113.9 kg)  09/02/17 246 lb 8 oz (111.8 kg)  06/02/17 249 lb 8 oz (113.2 kg)    Physical Exam  Constitutional: He is oriented to person, place, and time. He appears well-developed and well-nourished.  HENT:  Head: Normocephalic and atraumatic.  Neck: Neck supple.  Cardiovascular: Normal rate  and regular rhythm.  Pulmonary/Chest: Effort normal and breath sounds normal. He has no wheezes.  Abdominal: Soft. Bowel sounds are normal. There is no hepatosplenomegaly. There is no tenderness.  Musculoskeletal: He exhibits no edema.  Lymphadenopathy:    He has no cervical adenopathy.  Neurological: He is alert and oriented to person, place, and time.  Skin: Skin is warm  and dry.  Psychiatric: He has a normal mood and affect. His behavior is normal.  Vitals reviewed.   Results for orders placed or performed during the hospital encounter of 11/25/17  Lipid panel  Result Value Ref Range   Cholesterol 196 0 - 200 mg/dL   Triglycerides 195 (H) <150 mg/dL   HDL 33 (L) >40 mg/dL   Total CHOL/HDL Ratio 5.9 RATIO   VLDL 39 0 - 40 mg/dL   LDL Cholesterol 124 (H) 0 - 99 mg/dL  Comprehensive metabolic panel  Result Value Ref Range   Sodium 141 135 - 145 mmol/L   Potassium 5.0 3.5 - 5.1 mmol/L   Chloride 105 98 - 111 mmol/L   CO2 25 22 - 32 mmol/L   Glucose, Bld 120 (H) 70 - 99 mg/dL   BUN 16 6 - 20 mg/dL   Creatinine, Ser 1.14 0.61 - 1.24 mg/dL   Calcium 9.5 8.9 - 10.3 mg/dL   Total Protein 7.5 6.5 - 8.1 g/dL   Albumin 4.2 3.5 - 5.0 g/dL   AST 42 (H) 15 - 41 U/L   ALT 97 (H) 0 - 44 U/L   Alkaline Phosphatase 83 38 - 126 U/L   Total Bilirubin 1.5 (H) 0.3 - 1.2 mg/dL   GFR calc non Af Amer >60 >60 mL/min   GFR calc Af Amer >60 >60 mL/min   Anion gap 11 5 - 15      Assessment & Plan:   Encounter Diagnoses  Name Primary?  . Essential hypertension, benign Yes  . Mixed hyperlipidemia   . Hypothyroidism following radioiodine therapy   . Gastroesophageal reflux disease, esophagitis presence not specified   . Obesity, unspecified classification, unspecified obesity type, unspecified whether serious comorbidity present   . Tobacco use disorder   . Unable to read or write   . Screening for colon cancer   . Pre-diabetes   . Elevated LFTs   . Screening for prostate cancer     -reviewed labs with pt -pt was given ifobt for colon cancer screening -counseled on Smoking cessation -pt to Continue current meds -counseled pt on lowfat diet and regular exercise -pt to follow up 3 months.  RTO sooner prn  psa with next labs

## 2018-01-04 IMAGING — DX DG RIBS W/ CHEST 3+V*R*
5 series · 5 of 5 positions shown · non-contrast
Comparison: Chest radiograph April 30, 2010

CLINICAL DATA: Pain after cough

EXAM:
RIGHT RIBS AND CHEST - 3+ VIEW

[chest pa]
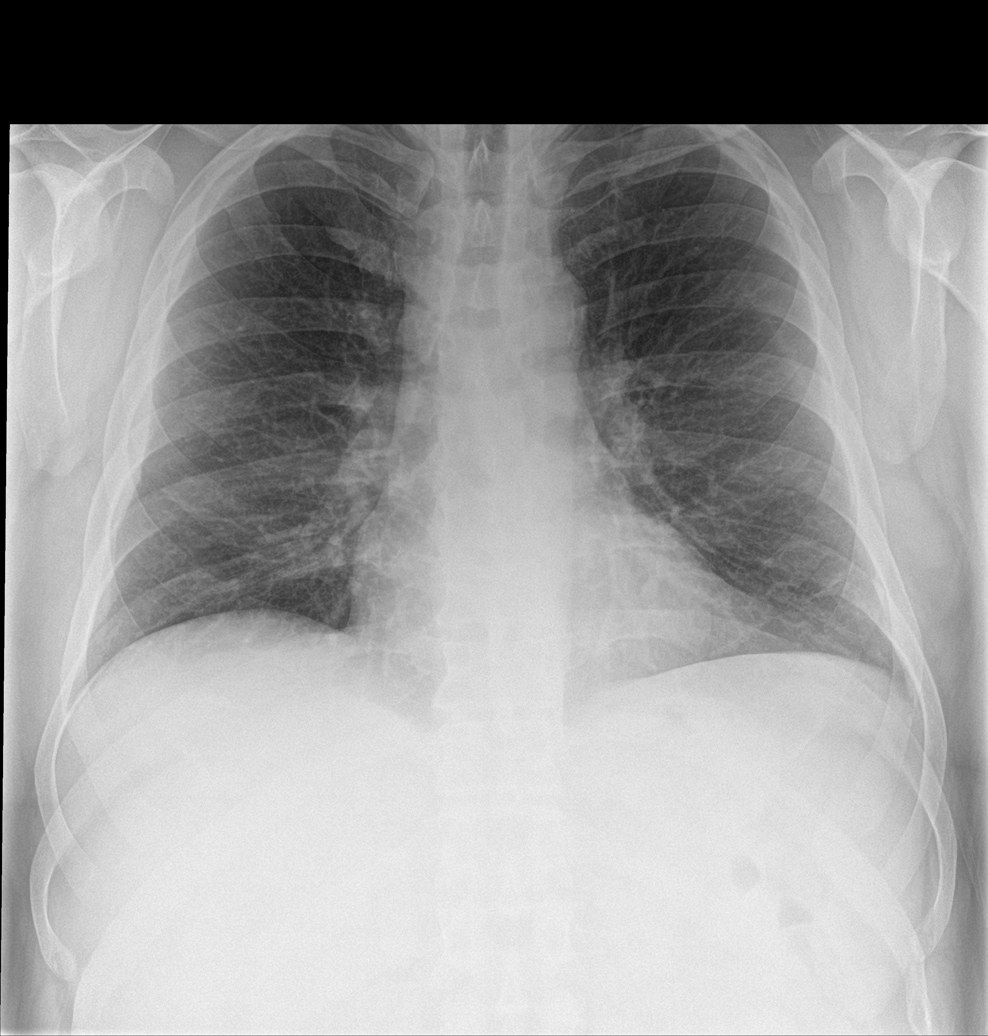

[rib pa (1 of 2)]
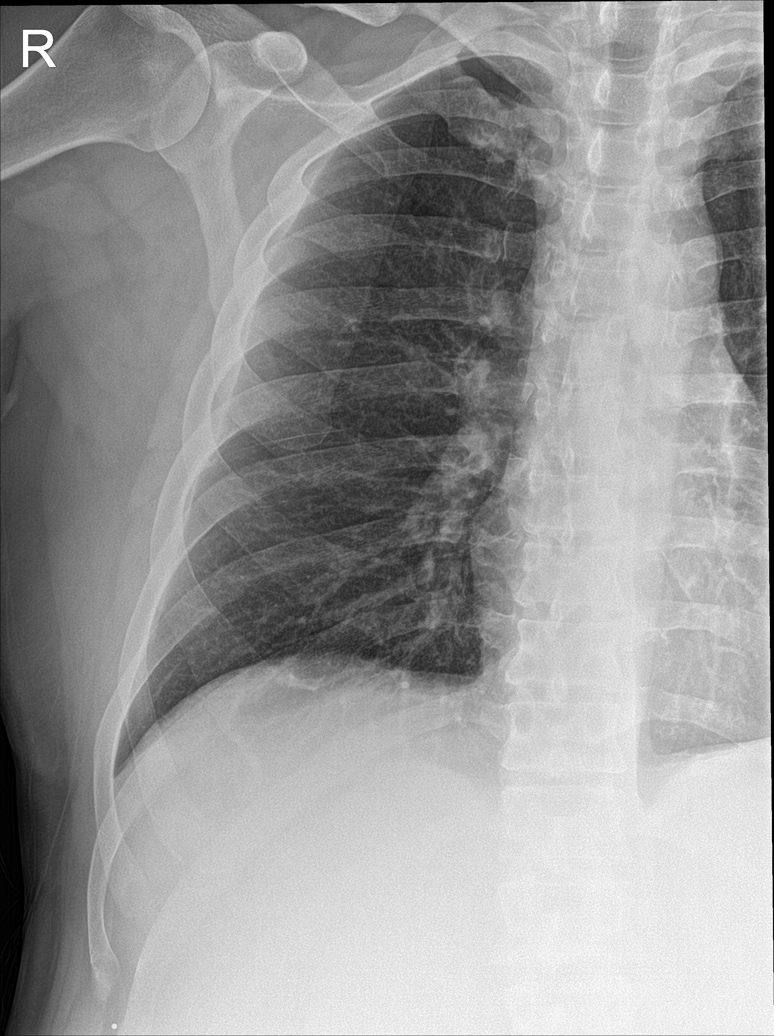

[rib pa obl (1 of 2)]
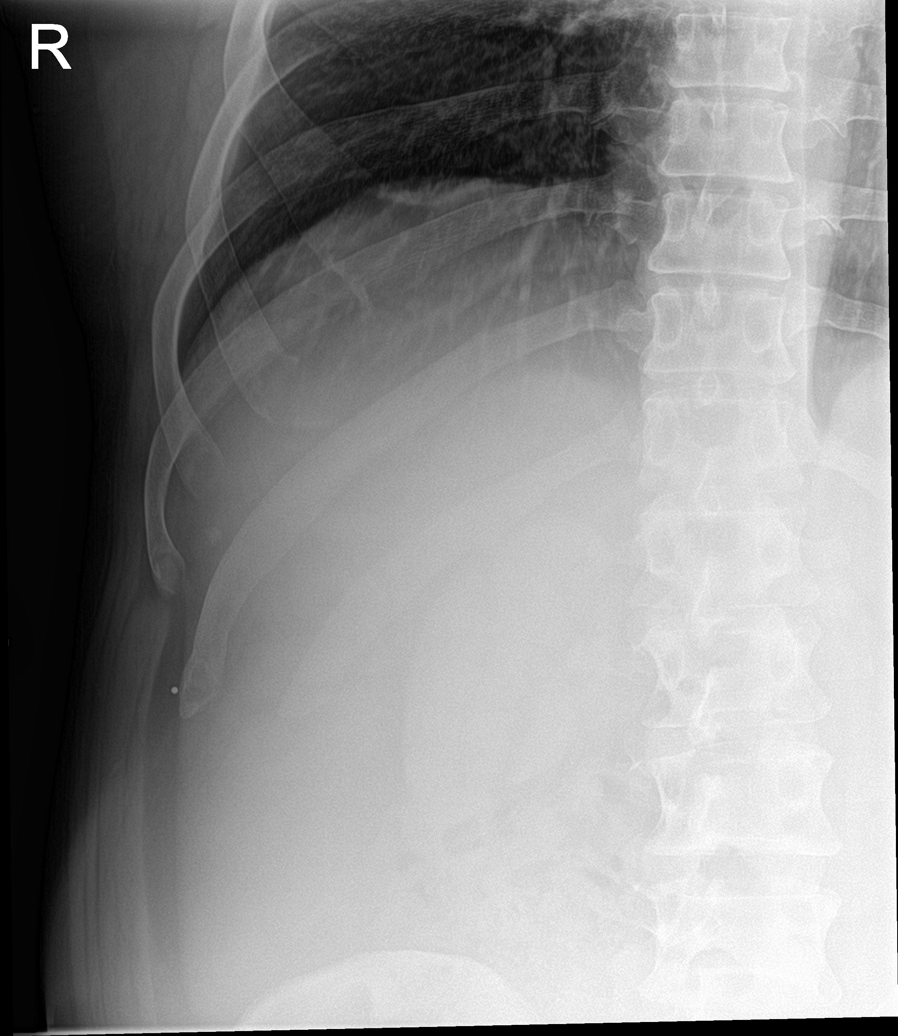

[rib pa obl (2 of 2)]
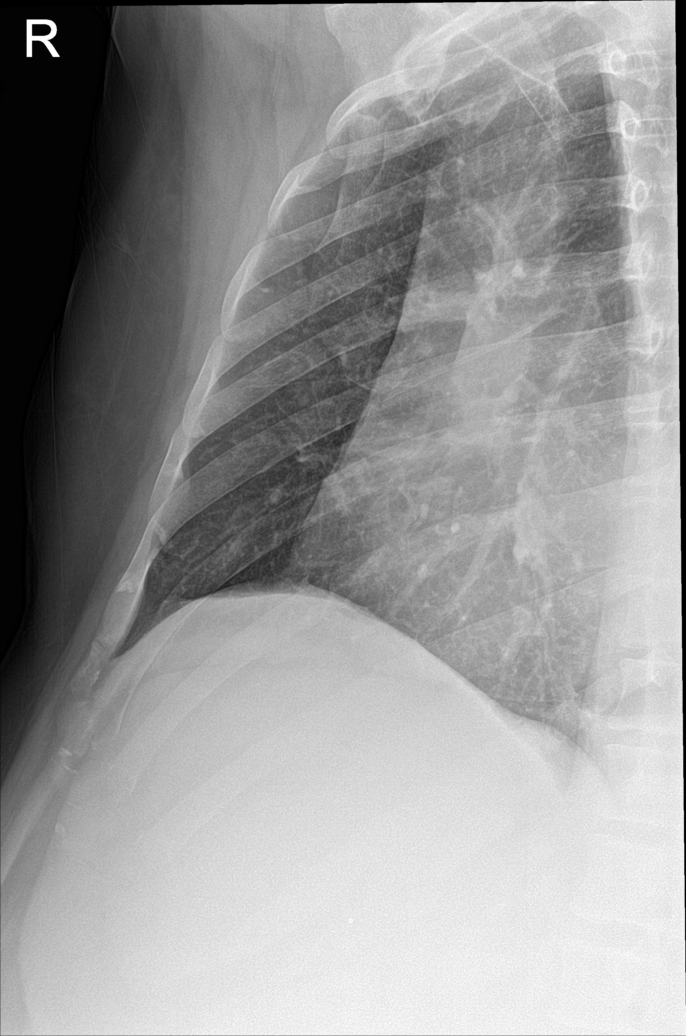

[rib pa (2 of 2)]
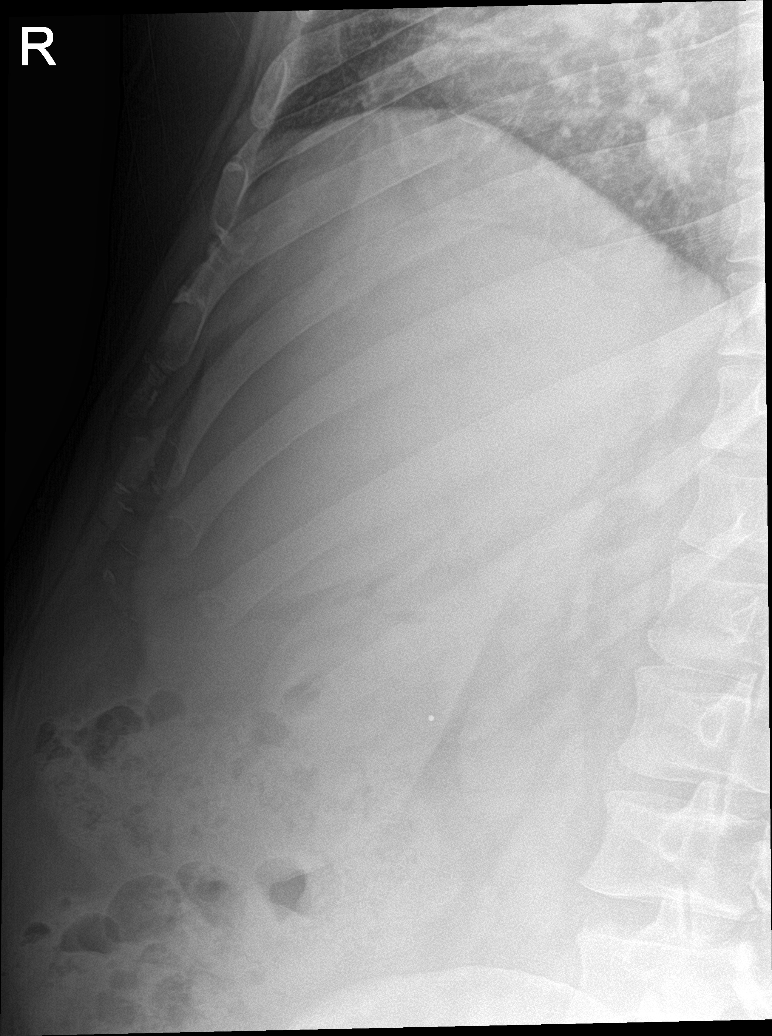

[5 of 5 positions shown; findings below may reference images not displayed]

FINDINGS: Frontal chest as well as oblique and cone-down lower rib images were
obtained. There is no edema or consolidation. Heart size and
pulmonary vascularity are normal. No adenopathy. There is aortic
atherosclerosis. There is an old healed fracture of the right
clavicle, stable.

There is no pneumothorax or pleural effusion. No evident rib
fracture.
IMPRESSION: No evident rib fracture. Old healed right clavicle fracture noted.
There is aortic atherosclerosis. Lungs clear.

## 2018-02-10 ENCOUNTER — Other Ambulatory Visit: Payer: Self-pay | Admitting: Physician Assistant

## 2018-03-30 ENCOUNTER — Other Ambulatory Visit (HOSPITAL_COMMUNITY)
Admission: RE | Admit: 2018-03-30 | Discharge: 2018-03-30 | Disposition: A | Discharge status: Home / Self Care | Payer: Self-pay | Source: Ambulatory Visit | Attending: Physician Assistant | Admitting: Physician Assistant

## 2018-03-30 DIAGNOSIS — E890 Postprocedural hypothyroidism: Secondary | ICD-10-CM | POA: Insufficient documentation

## 2018-03-30 DIAGNOSIS — I10 Essential (primary) hypertension: Secondary | ICD-10-CM | POA: Insufficient documentation

## 2018-03-30 DIAGNOSIS — R7989 Other specified abnormal findings of blood chemistry: Secondary | ICD-10-CM

## 2018-03-30 DIAGNOSIS — R945 Abnormal results of liver function studies: Secondary | ICD-10-CM | POA: Insufficient documentation

## 2018-03-30 DIAGNOSIS — E782 Mixed hyperlipidemia: Secondary | ICD-10-CM | POA: Insufficient documentation

## 2018-03-30 DIAGNOSIS — R7303 Prediabetes: Secondary | ICD-10-CM | POA: Insufficient documentation

## 2018-03-30 DIAGNOSIS — Z125 Encounter for screening for malignant neoplasm of prostate: Secondary | ICD-10-CM | POA: Insufficient documentation

## 2018-03-30 LAB — LIPID PANEL
CHOL/HDL RATIO: 6.1 ratio
CHOLESTEROL: 188 mg/dL (ref 0–200)
HDL: 31 mg/dL — AB (ref >40)
LDL Cholesterol: 116 mg/dL — ABNORMAL HIGH (ref 0–99)
TRIGLYCERIDES: 206 mg/dL — AB (ref <150)
VLDL: 41 mg/dL — ABNORMAL HIGH (ref 0–40)

## 2018-03-30 LAB — COMPREHENSIVE METABOLIC PANEL
ALK PHOS: 72 U/L (ref 38–126)
ALT: 68 U/L — AB (ref 0–44)
AST: 33 U/L (ref 15–41)
Albumin: 4.3 g/dL (ref 3.5–5.0)
Anion gap: 7 (ref 5–15)
BILIRUBIN TOTAL: 0.8 mg/dL (ref 0.3–1.2)
BUN: 13 mg/dL (ref 6–20)
CALCIUM: 9.8 mg/dL (ref 8.9–10.3)
CO2: 28 mmol/L (ref 22–32)
CREATININE: 1.1 mg/dL (ref 0.61–1.24)
Chloride: 104 mmol/L (ref 98–111)
Glucose, Bld: 120 mg/dL — ABNORMAL HIGH (ref 70–99)
Potassium: 4.2 mmol/L (ref 3.5–5.1)
Sodium: 139 mmol/L (ref 135–145)
TOTAL PROTEIN: 7.8 g/dL (ref 6.5–8.1)

## 2018-03-30 LAB — PSA: PROSTATIC SPECIFIC ANTIGEN: 0.52 ng/mL (ref 0.00–4.00)

## 2018-03-30 LAB — TSH: TSH: 3.411 u[IU]/mL (ref 0.350–4.500)

## 2018-03-30 LAB — HEMOGLOBIN A1C
Hgb A1c MFr Bld: 6.2 % — ABNORMAL HIGH (ref 4.8–5.6)
Mean Plasma Glucose: 131.24 mg/dL

## 2018-04-04 ENCOUNTER — Encounter: Payer: Self-pay | Admitting: Physician Assistant

## 2018-04-04 ENCOUNTER — Ambulatory Visit: Payer: Self-pay | Admitting: Physician Assistant

## 2018-04-04 VITALS — BP 116/80 | HR 80 | Temp 97.7°F | Ht 71.0 in | Wt 252.0 lb

## 2018-04-04 DIAGNOSIS — E669 Obesity, unspecified: Secondary | ICD-10-CM

## 2018-04-04 DIAGNOSIS — F17200 Nicotine dependence, unspecified, uncomplicated: Secondary | ICD-10-CM

## 2018-04-04 DIAGNOSIS — E782 Mixed hyperlipidemia: Secondary | ICD-10-CM

## 2018-04-04 DIAGNOSIS — R945 Abnormal results of liver function studies: Secondary | ICD-10-CM

## 2018-04-04 DIAGNOSIS — K219 Gastro-esophageal reflux disease without esophagitis: Secondary | ICD-10-CM

## 2018-04-04 DIAGNOSIS — E890 Postprocedural hypothyroidism: Secondary | ICD-10-CM

## 2018-04-04 DIAGNOSIS — R7303 Prediabetes: Secondary | ICD-10-CM

## 2018-04-04 DIAGNOSIS — I10 Essential (primary) hypertension: Secondary | ICD-10-CM

## 2018-04-04 DIAGNOSIS — Z550 Illiteracy and low-level literacy: Secondary | ICD-10-CM

## 2018-04-04 DIAGNOSIS — R7989 Other specified abnormal findings of blood chemistry: Secondary | ICD-10-CM

## 2018-04-04 NOTE — Progress Notes (Signed)
BP 116/80 (BP Location: Left Arm, Patient Position: Sitting, Cuff Size: Normal)   Pulse 80   Temp 97.7 F (36.5 C)   Ht 5\' 11"  (1.803 m)   Wt 252 lb (114.3 kg)   SpO2 97%   BMI 35.15 kg/m    Subjective:    Patient ID: Donald Chambers, male    DOB: 1965-05-12, 53 y.o.   MRN: [id number]  HPI: Donald Chambers is a 53 y.o. male presenting on 04/04/2018 for Hyperlipidemia; Hypertension; and Thyroid Problem   HPI   Pt is still smoking   He says he is smoking because of weight gain.     No complaints today  Pt says he occasionally has dizzy spells.  He has had those for years.  He says they are not as much as they were in the past.  Says it only every now and then- 2 or 3 times/month.  It only lasts a minute or two.   Happens when he is bending over for too long or if he turns his head really fast.   Relevant past medical, surgical, family and social history reviewed and updated as indicated. Interim medical history since our last visit reviewed. Allergies and medications reviewed and updated.   Current Outpatient Medications:  .  atorvastatin (LIPITOR) 20 MG tablet, TAKE 1 Tablet BY MOUTH ONCE DAILY, Disp: 90 tablet, Rfl: 1 .  Cholecalciferol (VITAMIN D3) 5000 units CAPS, Take 1 capsule (5,000 Units total) by mouth daily. (Patient taking differently: Take 4,000 Units by mouth daily. ), Disp: 90 capsule, Rfl: 0 .  lisinopril-hydrochlorothiazide (PRINZIDE,ZESTORETIC) 10-12.5 MG tablet, TAKE 1 Tablet BY MOUTH ONCE DAILY, Disp: 90 tablet, Rfl: 1 .  loratadine (CLARITIN) 10 MG tablet, Take 10 mg by mouth daily., Disp: , Rfl:  .  Omega-3 Fatty Acids (FISH OIL) 1000 MG CAPS, Take 1 capsule by mouth daily., Disp: , Rfl:  .  omeprazole (PRILOSEC) 40 MG capsule, Take 1 capsule (40 mg total) by mouth daily., Disp: 90 capsule, Rfl: 2 .  SYNTHROID 137 MCG tablet, TAKE 1 Tablet BY MOUTH ONCE DAILY BEFORE BREAKFAST, Disp: 90 tablet, Rfl: 1    Review of Systems  Constitutional: Positive for  diaphoresis. Negative for appetite change, chills, fatigue, fever and unexpected weight change.  HENT: Negative for congestion, dental problem, drooling, ear pain, facial swelling, hearing loss, mouth sores, sneezing, sore throat, trouble swallowing and voice change.   Eyes: Negative for pain, discharge, redness, itching and visual disturbance.  Respiratory: Negative for cough, choking, shortness of breath and wheezing.   Cardiovascular: Positive for palpitations. Negative for chest pain and leg swelling.  Gastrointestinal: Negative for abdominal pain, blood in stool, constipation, diarrhea and vomiting.  Endocrine: Positive for heat intolerance. Negative for cold intolerance and polydipsia.  Genitourinary: Negative for decreased urine volume, dysuria and hematuria.  Musculoskeletal: Negative for arthralgias, back pain and gait problem.  Skin: Negative for rash.  Allergic/Immunologic: Negative for environmental allergies.  Neurological: Negative for seizures, syncope, light-headedness and headaches.  Hematological: Negative for adenopathy.  Psychiatric/Behavioral: Negative for agitation, dysphoric mood and suicidal ideas. The patient is not nervous/anxious.     Per HPI unless specifically indicated above     Objective:    BP 116/80 (BP Location: Left Arm, Patient Position: Sitting, Cuff Size: Normal)   Pulse 80   Temp 97.7 F (36.5 C)   Ht 5\' 11"  (1.803 m)   Wt 252 lb (114.3 kg)   SpO2 97%   BMI 35.15 kg/m  Wt Readings from Last 3 Encounters:  04/04/18 252 lb (114.3 kg)  12/02/17 251 lb (113.9 kg)  09/02/17 246 lb 8 oz (111.8 kg)     Orthostatic VS for the past 24 hrs:  BP- Lying Pulse- Lying BP- Sitting Pulse- Sitting BP- Standing at 0 minutes Pulse- Standing at 0 minutes  04/04/18 0848 131/81 72 132/84 77 121/83 82      Physical Exam Vitals signs reviewed.  Constitutional:      Appearance: He is well-developed.  HENT:     Head: Normocephalic and atraumatic.  Neck:      Musculoskeletal: Neck supple.  Cardiovascular:     Rate and Rhythm: Normal rate and regular rhythm.  Pulmonary:     Effort: Pulmonary effort is normal.     Breath sounds: Normal breath sounds. No wheezing.  Abdominal:     General: Bowel sounds are normal.     Palpations: Abdomen is soft.     Tenderness: There is no abdominal tenderness.  Lymphadenopathy:     Cervical: No cervical adenopathy.  Skin:    General: Skin is warm and dry.  Neurological:     Mental Status: He is alert and oriented to person, place, and time.  Psychiatric:        Behavior: Behavior normal.     Results for orders placed or performed during the hospital encounter of 03/30/18  Hemoglobin A1c  Result Value Ref Range   Hgb A1c MFr Bld 6.2 (H) 4.8 - 5.6 %   Mean Plasma Glucose 131.24 mg/dL  TSH  Result Value Ref Range   TSH 3.411 0.350 - 4.500 uIU/mL  PSA  Result Value Ref Range   Prostatic Specific Antigen 0.52 0.00 - 4.00 ng/mL  Lipid panel  Result Value Ref Range   Cholesterol 188 0 - 200 mg/dL   Triglycerides 206 (H) <150 mg/dL   HDL 31 (L) >40 mg/dL   Total CHOL/HDL Ratio 6.1 RATIO   VLDL 41 (H) 0 - 40 mg/dL   LDL Cholesterol 116 (H) 0 - 99 mg/dL  Comprehensive metabolic panel  Result Value Ref Range   Sodium 139 135 - 145 mmol/L   Potassium 4.2 3.5 - 5.1 mmol/L   Chloride 104 98 - 111 mmol/L   CO2 28 22 - 32 mmol/L   Glucose, Bld 120 (H) 70 - 99 mg/dL   BUN 13 6 - 20 mg/dL   Creatinine, Ser 1.10 0.61 - 1.24 mg/dL   Calcium 9.8 8.9 - 10.3 mg/dL   Total Protein 7.8 6.5 - 8.1 g/dL   Albumin 4.3 3.5 - 5.0 g/dL   AST 33 15 - 41 U/L   ALT 68 (H) 0 - 44 U/L   Alkaline Phosphatase 72 38 - 126 U/L   Total Bilirubin 0.8 0.3 - 1.2 mg/dL   GFR calc non Af Amer >60 >60 mL/min   GFR calc Af Amer >60 >60 mL/min   Anion gap 7 5 - 15      Assessment & Plan:   Encounter Diagnoses  Name Primary?  . Essential hypertension, benign Yes  . Mixed hyperlipidemia   . Hypothyroidism following  radioiodine therapy   . Tobacco use disorder   . Pre-diabetes   . Elevated LFTs   . Unable to read or write   . Gastroesophageal reflux disease, esophagitis presence not specified   . Obesity, unspecified classification, unspecified obesity type, unspecified whether serious comorbidity present     -reviewed labs with pt -counseled smoking cessation -pt  to continue current medications -discussed dizzy episodes.  Counseled to stay hydrated.  Pt to rTO if episodes become more frequent, last longer or has any other associated symptoms -pt to follow up 3 months.  RTO sooner prn

## 2018-05-23 ENCOUNTER — Other Ambulatory Visit: Payer: Self-pay | Admitting: Physician Assistant

## 2018-07-04 ENCOUNTER — Ambulatory Visit: Payer: Self-pay | Admitting: Physician Assistant

## 2018-08-23 ENCOUNTER — Other Ambulatory Visit: Payer: Self-pay | Admitting: Physician Assistant

## 2018-09-27 ENCOUNTER — Other Ambulatory Visit: Payer: Self-pay

## 2018-09-27 ENCOUNTER — Other Ambulatory Visit (HOSPITAL_COMMUNITY)
Admission: RE | Admit: 2018-09-27 | Discharge: 2018-09-27 | Disposition: A | Discharge status: Home / Self Care | Payer: Self-pay | Source: Ambulatory Visit | Attending: Physician Assistant | Admitting: Physician Assistant

## 2018-09-27 DIAGNOSIS — I10 Essential (primary) hypertension: Secondary | ICD-10-CM | POA: Insufficient documentation

## 2018-09-27 DIAGNOSIS — R945 Abnormal results of liver function studies: Secondary | ICD-10-CM | POA: Insufficient documentation

## 2018-09-27 DIAGNOSIS — R7989 Other specified abnormal findings of blood chemistry: Secondary | ICD-10-CM

## 2018-09-27 DIAGNOSIS — E782 Mixed hyperlipidemia: Secondary | ICD-10-CM | POA: Insufficient documentation

## 2018-09-27 LAB — COMPREHENSIVE METABOLIC PANEL
ALT: 67 U/L — ABNORMAL HIGH (ref 0–44)
AST: 33 U/L (ref 15–41)
Albumin: 4.3 g/dL (ref 3.5–5.0)
Alkaline Phosphatase: 82 U/L (ref 38–126)
Anion gap: 9 (ref 5–15)
BUN: 15 mg/dL (ref 6–20)
CO2: 29 mmol/L (ref 22–32)
Calcium: 9.9 mg/dL (ref 8.9–10.3)
Chloride: 103 mmol/L (ref 98–111)
Creatinine, Ser: 1.13 mg/dL (ref 0.61–1.24)
GFR calc Af Amer: >60 mL/min (ref >60)
GFR calc non Af Amer: >60 mL/min (ref >60)
Glucose, Bld: 120 mg/dL — ABNORMAL HIGH (ref 70–99)
Potassium: 4 mmol/L (ref 3.5–5.1)
Sodium: 141 mmol/L (ref 135–145)
Total Bilirubin: 1 mg/dL (ref 0.3–1.2)
Total Protein: 7.6 g/dL (ref 6.5–8.1)

## 2018-09-27 LAB — LIPID PANEL
Cholesterol: 178 mg/dL (ref 0–200)
HDL: 29 mg/dL — ABNORMAL LOW (ref >40)
LDL Cholesterol: 112 mg/dL — ABNORMAL HIGH (ref 0–99)
Total CHOL/HDL Ratio: 6.1 RATIO
Triglycerides: 187 mg/dL — ABNORMAL HIGH (ref <150)
VLDL: 37 mg/dL (ref 0–40)

## 2018-10-03 ENCOUNTER — Ambulatory Visit: Payer: Self-pay | Admitting: Physician Assistant

## 2018-10-03 ENCOUNTER — Encounter: Payer: Self-pay | Admitting: Physician Assistant

## 2018-10-03 DIAGNOSIS — F17200 Nicotine dependence, unspecified, uncomplicated: Secondary | ICD-10-CM

## 2018-10-03 DIAGNOSIS — R7989 Other specified abnormal findings of blood chemistry: Secondary | ICD-10-CM

## 2018-10-03 DIAGNOSIS — Z125 Encounter for screening for malignant neoplasm of prostate: Secondary | ICD-10-CM

## 2018-10-03 DIAGNOSIS — Z550 Illiteracy and low-level literacy: Secondary | ICD-10-CM

## 2018-10-03 DIAGNOSIS — K219 Gastro-esophageal reflux disease without esophagitis: Secondary | ICD-10-CM

## 2018-10-03 DIAGNOSIS — I10 Essential (primary) hypertension: Secondary | ICD-10-CM

## 2018-10-03 DIAGNOSIS — R7303 Prediabetes: Secondary | ICD-10-CM

## 2018-10-03 DIAGNOSIS — E782 Mixed hyperlipidemia: Secondary | ICD-10-CM

## 2018-10-03 DIAGNOSIS — E890 Postprocedural hypothyroidism: Secondary | ICD-10-CM

## 2018-10-03 NOTE — Progress Notes (Signed)
There were no vitals taken for this visit.   Subjective:    Patient ID: Donald Chambers, male    DOB: Oct 26, 1965, 53 y.o.   MRN: [id number]  HPI: Donald Chambers is a 53 y.o. male presenting on 10/03/2018 for Hypertension and Hyperlipidemia   HPI   This is a telemedicine appointment due to coronavirus pandemic.  It is via Telephone as pt does not have a smart phone with video capabiities or a computer  I connected with  Donald Chambers on 10/03/18 by a video enabled telemedicine application and verified that I am speaking with the correct person using two identifiers.   I discussed the limitations of evaluation and management by telemedicine. The patient expressed understanding and agreed to proceed.  Pt is at home.  Provider is at office.     Pt says he is doing well.  He says he is wearing a mask when he goes out in public.  He is still Smoking.  He Denies sob.  He has no complaints today and denies CP, fever, sob.      Relevant past medical, surgical, family and social history reviewed and updated as indicated. Interim medical history since our last visit reviewed. Allergies and medications reviewed and updated.    Current Outpatient Medications:  .  atorvastatin (LIPITOR) 20 MG tablet, TAKE 1 Tablet BY MOUTH ONCE DAILY, Disp: 90 tablet, Rfl: 1 .  cetirizine (ZYRTEC) 10 MG tablet, Take 10 mg by mouth daily., Disp: , Rfl:  .  Cholecalciferol (VITAMIN D3) 5000 units CAPS, Take 1 capsule (5,000 Units total) by mouth daily., Disp: 90 capsule, Rfl: 0 .  lisinopril-hydrochlorothiazide (ZESTORETIC) 10-12.5 MG tablet, TAKE 1 Tablet BY MOUTH ONCE DAILY, Disp: 90 tablet, Rfl: 1 .  Omega-3 Fatty Acids (FISH OIL) 1000 MG CAPS, Take 2 capsules by mouth daily. , Disp: , Rfl:  .  omeprazole (PRILOSEC) 40 MG capsule, TAKE 1 Capsule BY MOUTH ONCE DAILY, Disp: 90 capsule, Rfl: 2 .  SYNTHROID 137 MCG tablet, TAKE 1 Tablet BY MOUTH ONCE DAILY BEFORE BREAKFAST, Disp: 90 tablet, Rfl: 1 .   loratadine (CLARITIN) 10 MG tablet, Take 10 mg by mouth daily., Disp: , Rfl:     Review of Systems  Per HPI unless specifically indicated above     Objective:    There were no vitals taken for this visit.  Wt Readings from Last 3 Encounters:  04/04/18 252 lb (114.3 kg)  12/02/17 251 lb (113.9 kg)  09/02/17 246 lb 8 oz (111.8 kg)    Physical Exam Pulmonary:     Effort: No respiratory distress.  Neurological:     Mental Status: He is alert and oriented to person, place, and time.  Psychiatric:        Attention and Perception: Attention normal.        Speech: Speech normal.        Behavior: Behavior is cooperative.     Results for orders placed or performed during the hospital encounter of 09/27/18  Lipid panel  Result Value Ref Range   Cholesterol 178 0 - 200 mg/dL   Triglycerides 187 (H) <150 mg/dL   HDL 29 (L) >40 mg/dL   Total CHOL/HDL Ratio 6.1 RATIO   VLDL 37 0 - 40 mg/dL   LDL Cholesterol 112 (H) 0 - 99 mg/dL  Comprehensive metabolic panel  Result Value Ref Range   Sodium 141 135 - 145 mmol/L   Potassium 4.0 3.5 - 5.1 mmol/L  Chloride 103 98 - 111 mmol/L   CO2 29 22 - 32 mmol/L   Glucose, Bld 120 (H) 70 - 99 mg/dL   BUN 15 6 - 20 mg/dL   Creatinine, Ser 1.13 0.61 - 1.24 mg/dL   Calcium 9.9 8.9 - 10.3 mg/dL   Total Protein 7.6 6.5 - 8.1 g/dL   Albumin 4.3 3.5 - 5.0 g/dL   AST 33 15 - 41 U/L   ALT 67 (H) 0 - 44 U/L   Alkaline Phosphatase 82 38 - 126 U/L   Total Bilirubin 1.0 0.3 - 1.2 mg/dL   GFR calc non Af Amer >60 >60 mL/min   GFR calc Af Amer >60 >60 mL/min   Anion gap 9 5 - 15      Assessment & Plan:   Encounter Diagnoses  Name Primary?  . Essential hypertension, benign Yes  . Mixed hyperlipidemia   . Hypothyroidism following radioiodine therapy   . Tobacco use disorder   . Unable to read or write   . Gastroesophageal reflux disease, esophagitis presence not specified   . Pre-diabetes   . Elevated LFTs     -reviewed labs with  pt -encouraged Smoking cesation -encouraged pt to Wear a mask when in public per CDC guidelines -pt to continue current medications -pt to follow up 4 months.  He is to contact office sooner prn

## 2019-02-14 ENCOUNTER — Other Ambulatory Visit: Payer: Self-pay

## 2019-02-14 DIAGNOSIS — Z20822 Contact with and (suspected) exposure to covid-19: Secondary | ICD-10-CM

## 2019-02-16 LAB — NOVEL CORONAVIRUS, NAA: SARS-CoV-2, NAA: NOT DETECTED

## 2019-02-27 ENCOUNTER — Other Ambulatory Visit: Payer: Self-pay | Admitting: Physician Assistant

## 2019-02-27 MED ORDER — LISINOPRIL-HYDROCHLOROTHIAZIDE 10-12.5 MG PO TABS: 1.0000 | ORAL_TABLET | Freq: Every day | ORAL | 0 refills | Status: DC

## 2019-02-27 MED ORDER — ATORVASTATIN CALCIUM 20 MG PO TABS: 20.0000 mg | ORAL_TABLET | Freq: Every day | ORAL | 0 refills | Status: DC

## 2019-02-27 MED ORDER — OMEPRAZOLE 40 MG PO CPDR: 40.0000 mg | DELAYED_RELEASE_CAPSULE | Freq: Every day | ORAL | 0 refills | Status: DC

## 2019-02-27 MED ORDER — LEVOTHYROXINE SODIUM 137 MCG PO TABS: ORAL_TABLET | ORAL | 0 refills | Status: DC

## 2019-04-03 ENCOUNTER — Ambulatory Visit: Payer: Self-pay | Admitting: Physician Assistant

## 2019-06-19 ENCOUNTER — Ambulatory Visit: Payer: Medicare Other | Attending: Internal Medicine

## 2019-06-19 ENCOUNTER — Other Ambulatory Visit: Payer: Self-pay

## 2019-06-19 DIAGNOSIS — Z20822 Contact with and (suspected) exposure to covid-19: Secondary | ICD-10-CM

## 2019-06-20 LAB — SARS-COV-2, NAA 2 DAY TAT

## 2019-06-20 LAB — NOVEL CORONAVIRUS, NAA: SARS-CoV-2, NAA: NOT DETECTED

## 2019-06-21 ENCOUNTER — Telehealth: Payer: Self-pay | Admitting: *Deleted

## 2019-06-21 NOTE — Telephone Encounter (Signed)
Reviewed negative Covid 19 results with the patient.

## 2020-07-17 DIAGNOSIS — F17210 Nicotine dependence, cigarettes, uncomplicated: Secondary | ICD-10-CM | POA: Diagnosis not present

## 2020-07-17 DIAGNOSIS — Z1389 Encounter for screening for other disorder: Secondary | ICD-10-CM | POA: Diagnosis not present

## 2020-07-17 DIAGNOSIS — Z0000 Encounter for general adult medical examination without abnormal findings: Secondary | ICD-10-CM | POA: Diagnosis not present

## 2020-07-17 DIAGNOSIS — E039 Hypothyroidism, unspecified: Secondary | ICD-10-CM | POA: Diagnosis not present

## 2020-07-17 DIAGNOSIS — I10 Essential (primary) hypertension: Secondary | ICD-10-CM | POA: Diagnosis not present

## 2020-07-17 DIAGNOSIS — E785 Hyperlipidemia, unspecified: Secondary | ICD-10-CM | POA: Diagnosis not present

## 2020-07-17 DIAGNOSIS — K219 Gastro-esophageal reflux disease without esophagitis: Secondary | ICD-10-CM | POA: Diagnosis not present

## 2020-07-17 DIAGNOSIS — R7303 Prediabetes: Secondary | ICD-10-CM | POA: Diagnosis not present

## 2020-07-22 DIAGNOSIS — Z1211 Encounter for screening for malignant neoplasm of colon: Secondary | ICD-10-CM | POA: Diagnosis not present

## 2021-01-20 DIAGNOSIS — D171 Benign lipomatous neoplasm of skin and subcutaneous tissue of trunk: Secondary | ICD-10-CM | POA: Diagnosis not present

## 2021-01-20 DIAGNOSIS — F17210 Nicotine dependence, cigarettes, uncomplicated: Secondary | ICD-10-CM | POA: Diagnosis not present

## 2021-01-20 DIAGNOSIS — E039 Hypothyroidism, unspecified: Secondary | ICD-10-CM | POA: Diagnosis not present

## 2021-01-20 DIAGNOSIS — I10 Essential (primary) hypertension: Secondary | ICD-10-CM | POA: Diagnosis not present

## 2021-01-20 DIAGNOSIS — Z23 Encounter for immunization: Secondary | ICD-10-CM | POA: Diagnosis not present

## 2021-01-20 DIAGNOSIS — R7303 Prediabetes: Secondary | ICD-10-CM | POA: Diagnosis not present

## 2021-01-20 DIAGNOSIS — E785 Hyperlipidemia, unspecified: Secondary | ICD-10-CM | POA: Diagnosis not present

## 2021-02-04 DIAGNOSIS — I10 Essential (primary) hypertension: Secondary | ICD-10-CM | POA: Diagnosis not present

## 2021-02-24 DIAGNOSIS — Z23 Encounter for immunization: Secondary | ICD-10-CM | POA: Diagnosis not present

## 2021-02-24 DIAGNOSIS — I10 Essential (primary) hypertension: Secondary | ICD-10-CM | POA: Diagnosis not present

## 2021-07-14 DIAGNOSIS — E785 Hyperlipidemia, unspecified: Secondary | ICD-10-CM | POA: Diagnosis not present

## 2021-07-14 DIAGNOSIS — I10 Essential (primary) hypertension: Secondary | ICD-10-CM | POA: Diagnosis not present

## 2021-07-14 DIAGNOSIS — R7303 Prediabetes: Secondary | ICD-10-CM | POA: Diagnosis not present

## 2021-07-14 DIAGNOSIS — E039 Hypothyroidism, unspecified: Secondary | ICD-10-CM | POA: Diagnosis not present

## 2021-07-14 DIAGNOSIS — K219 Gastro-esophageal reflux disease without esophagitis: Secondary | ICD-10-CM | POA: Diagnosis not present

## 2021-09-12 DIAGNOSIS — F17210 Nicotine dependence, cigarettes, uncomplicated: Secondary | ICD-10-CM | POA: Diagnosis not present

## 2021-09-12 DIAGNOSIS — Z0000 Encounter for general adult medical examination without abnormal findings: Secondary | ICD-10-CM | POA: Diagnosis not present

## 2021-09-12 DIAGNOSIS — E785 Hyperlipidemia, unspecified: Secondary | ICD-10-CM | POA: Diagnosis not present

## 2021-09-12 DIAGNOSIS — K219 Gastro-esophageal reflux disease without esophagitis: Secondary | ICD-10-CM | POA: Diagnosis not present

## 2021-09-12 DIAGNOSIS — R7303 Prediabetes: Secondary | ICD-10-CM | POA: Diagnosis not present

## 2021-09-12 DIAGNOSIS — I10 Essential (primary) hypertension: Secondary | ICD-10-CM | POA: Diagnosis not present

## 2021-09-12 DIAGNOSIS — E039 Hypothyroidism, unspecified: Secondary | ICD-10-CM | POA: Diagnosis not present

## 2021-09-27 DIAGNOSIS — Z1211 Encounter for screening for malignant neoplasm of colon: Secondary | ICD-10-CM | POA: Diagnosis not present

## 2021-09-27 DIAGNOSIS — Z1212 Encounter for screening for malignant neoplasm of rectum: Secondary | ICD-10-CM | POA: Diagnosis not present

## 2021-09-29 LAB — COLOGUARD: Cologuard: NEGATIVE

## 2022-01-27 DIAGNOSIS — K219 Gastro-esophageal reflux disease without esophagitis: Secondary | ICD-10-CM | POA: Diagnosis not present

## 2022-01-27 DIAGNOSIS — I10 Essential (primary) hypertension: Secondary | ICD-10-CM | POA: Diagnosis not present

## 2022-01-27 DIAGNOSIS — Z23 Encounter for immunization: Secondary | ICD-10-CM | POA: Diagnosis not present

## 2022-01-27 DIAGNOSIS — E785 Hyperlipidemia, unspecified: Secondary | ICD-10-CM | POA: Diagnosis not present

## 2022-01-27 DIAGNOSIS — E039 Hypothyroidism, unspecified: Secondary | ICD-10-CM | POA: Diagnosis not present

## 2022-01-27 DIAGNOSIS — R7303 Prediabetes: Secondary | ICD-10-CM | POA: Diagnosis not present

## 2022-05-09 DIAGNOSIS — R030 Elevated blood-pressure reading, without diagnosis of hypertension: Secondary | ICD-10-CM | POA: Diagnosis not present

## 2022-05-09 DIAGNOSIS — J019 Acute sinusitis, unspecified: Secondary | ICD-10-CM | POA: Diagnosis not present

## 2022-07-27 ENCOUNTER — Ambulatory Visit (INDEPENDENT_AMBULATORY_CARE_PROVIDER_SITE_OTHER): Payer: Medicare Other | Admitting: Internal Medicine

## 2022-07-27 ENCOUNTER — Encounter: Payer: Self-pay | Admitting: Internal Medicine

## 2022-07-27 VITALS — BP 130/80 | HR 83 | Ht 71.0 in | Wt 253.2 lb

## 2022-07-27 DIAGNOSIS — K219 Gastro-esophageal reflux disease without esophagitis: Secondary | ICD-10-CM | POA: Diagnosis not present

## 2022-07-27 DIAGNOSIS — E039 Hypothyroidism, unspecified: Secondary | ICD-10-CM

## 2022-07-27 DIAGNOSIS — Z1321 Encounter for screening for nutritional disorder: Secondary | ICD-10-CM

## 2022-07-27 DIAGNOSIS — Z131 Encounter for screening for diabetes mellitus: Secondary | ICD-10-CM | POA: Diagnosis not present

## 2022-07-27 DIAGNOSIS — I10 Essential (primary) hypertension: Secondary | ICD-10-CM | POA: Diagnosis not present

## 2022-07-27 DIAGNOSIS — Z114 Encounter for screening for human immunodeficiency virus [HIV]: Secondary | ICD-10-CM

## 2022-07-27 DIAGNOSIS — Z0001 Encounter for general adult medical examination with abnormal findings: Secondary | ICD-10-CM

## 2022-07-27 DIAGNOSIS — E559 Vitamin D deficiency, unspecified: Secondary | ICD-10-CM | POA: Diagnosis not present

## 2022-07-27 DIAGNOSIS — E890 Postprocedural hypothyroidism: Secondary | ICD-10-CM | POA: Diagnosis not present

## 2022-07-27 DIAGNOSIS — R7303 Prediabetes: Secondary | ICD-10-CM | POA: Diagnosis not present

## 2022-07-27 DIAGNOSIS — F17219 Nicotine dependence, cigarettes, with unspecified nicotine-induced disorders: Secondary | ICD-10-CM | POA: Diagnosis not present

## 2022-07-27 DIAGNOSIS — E785 Hyperlipidemia, unspecified: Secondary | ICD-10-CM

## 2022-07-27 DIAGNOSIS — Z122 Encounter for screening for malignant neoplasm of respiratory organs: Secondary | ICD-10-CM

## 2022-07-27 DIAGNOSIS — Z720 Tobacco use: Secondary | ICD-10-CM

## 2022-07-27 DIAGNOSIS — Z139 Encounter for screening, unspecified: Secondary | ICD-10-CM | POA: Diagnosis not present

## 2022-07-27 NOTE — Assessment & Plan Note (Signed)
Previously documented history of prediabetes. -Repeat A1c ordered today 

## 2022-07-27 NOTE — Assessment & Plan Note (Signed)
Currently prescribed atorvastatin 20 mg daily and fish oil supplementation as well. -Repeat lipid panel ordered today

## 2022-07-27 NOTE — Assessment & Plan Note (Signed)
Previously documented history of vitamin D deficiency.  He is currently on vitamin D supplementation. -Repeat vitamin D level ordered today

## 2022-07-27 NOTE — Patient Instructions (Signed)
It was a pleasure to see you today.  Thank you for giving [place] the opportunity to be involved in your care.  Below is a brief recap of your visit and next steps.  We will plan to see you again in 6 months.  Summary You have established care today We will check basic labs and request records from Dr. Donette Larry Lung cancer screening referral placed today We will follow up in 6 months

## 2022-07-27 NOTE — Assessment & Plan Note (Signed)
Previously documented history of essential hypertension.  He is currently prescribed lisinopril-HCTZ 10-12.5 mg twice daily.  BP today is 130/80. -No medication changes today

## 2022-07-27 NOTE — Assessment & Plan Note (Signed)
Symptoms are currently well-controlled with omeprazole 40 mg daily. -No medication changes today

## 2022-07-27 NOTE — Assessment & Plan Note (Signed)
He continues to smoke 1 pack/day of cigarettes and has been smoking for at least 40 years.  He is currently precontemplative with regards to cessation, but is agreeable to a referral for lung cancer screening. -Lung cancer screening referral placed today

## 2022-07-27 NOTE — Assessment & Plan Note (Signed)
Presenting today to establish care.  Available records and labs have been reviewed. -Baseline labs ordered today, including one-time HIV screening -Lung cancer screening referral placed today -Records from his previous provider have been requested -States that he completed Cologuard last fall -We will tentatively plan for follow-up in 6 months

## 2022-07-27 NOTE — Assessment & Plan Note (Signed)
History of secondary hypothyroidism following RAI in the setting of Graves' disease.  He is currently prescribed levothyroxine 137 mcg daily. -Repeat TFTs ordered today

## 2022-07-27 NOTE — Progress Notes (Signed)
New Patient Office Visit  Subjective    Patient ID: Donald Chambers, male    DOB: 05-23-1965  Age: 57 y.o. MRN: [id number]  CC:  Chief Complaint  Patient presents with   Establish Care    HPI Donald Chambers presents to establish care.  He is a 57 year old male who endorses a past medical history of secondary hypothyroidism, Graves' disease s/p RAI, HTN, HLD, GERD, and current tobacco use.  He has previously been followed by [facility] physicians in [place] (Dr. Donette Larry).  [name] reports feeling well today.  He is asymptomatic and has no acute concerns to discuss aside from desiring to establish care.  He is currently disabled due to blindness in the right eye.  He currently smokes 1 pack/day of cigarettes and has been smoking for 40 years.  He denies alcohol and illicit drug use.  His family medical history is significant for brain aneurysm in his father and atrial fibrillation.  Chronic medical conditions and outstanding preventative care items discussed today are individually addressed A/P below.   Outpatient Encounter Medications as of 07/27/2022  Medication Sig   atorvastatin (LIPITOR) 20 MG tablet Take 1 tablet (20 mg total) by mouth daily.   cetirizine (ZYRTEC) 10 MG tablet Take 10 mg by mouth daily.   Cholecalciferol (VITAMIN D3) 5000 units CAPS Take 1 capsule (5,000 Units total) by mouth daily.   levothyroxine (SYNTHROID) 137 MCG tablet TAKE 1 Tablet BY MOUTH ONCE DAILY BEFORE BREAKFAST   lisinopril-hydrochlorothiazide (ZESTORETIC) 10-12.5 MG tablet Take 1 tablet by mouth daily.   loratadine (CLARITIN) 10 MG tablet Take 10 mg by mouth daily.   Omega-3 Fatty Acids (FISH OIL) 1000 MG CAPS Take 2 capsules by mouth daily.    omeprazole (PRILOSEC) 40 MG capsule Take 1 capsule (40 mg total) by mouth daily.   No facility-administered encounter medications on file as of 07/27/2022.    Past Medical History:  Diagnosis Date   GERD (gastroesophageal reflux disease)     Hypercholesteremia    Hypertension    Hyperthyroidism    s/p RAI    History reviewed. No pertinent surgical history.  Family History  Problem Relation Age of Onset   Diabetes Father    Hypertension Father    Hypertension Mother     Social History   Socioeconomic History   Marital status: Single    Spouse name: Not on file   Number of children: Not on file   Years of education: Not on file   Highest education level: Not on file  Occupational History   Not on file  Tobacco Use   Smoking status: Every Day    Packs/day: 1.00    Years: 34.00    Additional pack years: 0.00    Total pack years: 34.00    Types: Cigarettes   Smokeless tobacco: Never  Vaping Use   Vaping Use: Never used  Substance and Sexual Activity   Alcohol use: No    Alcohol/week: 0.0 standard drinks of alcohol   Drug use: No   Sexual activity: Not on file  Other Topics Concern   Not on file  Social History Narrative   Not on file   Social Determinants of Health   Financial Resource Strain: Not on file  Food Insecurity: Not on file  Transportation Needs: Not on file  Physical Activity: Not on file  Stress: Not on file  Social Connections: Not on file  Intimate Partner Violence: Not on file   Review of Systems  Constitutional:  Negative for chills and fever.  HENT:  Negative for sore throat.   Respiratory:  Negative for cough and shortness of breath.   Cardiovascular:  Negative for chest pain, palpitations and leg swelling.  Gastrointestinal:  Negative for abdominal pain, blood in stool, constipation, diarrhea, nausea and vomiting.  Genitourinary:  Negative for dysuria and hematuria.  Musculoskeletal:  Negative for myalgias.  Skin:  Negative for itching and rash.  Neurological:  Negative for dizziness and headaches.  Psychiatric/Behavioral:  Negative for depression and suicidal ideas.    Objective    BP 130/80   Pulse 83   Ht 5\' 11"  (1.803 m)   Wt 253 lb 3.2 oz (114.9 kg)   SpO2 95%    BMI 35.31 kg/m   Physical Exam Vitals reviewed.  Constitutional:      General: He is not in acute distress.    Appearance: Normal appearance. He is obese. He is not ill-appearing.  HENT:     Head: Normocephalic and atraumatic.     Right Ear: External ear normal.     Left Ear: External ear normal.     Nose: Nose normal. No congestion or rhinorrhea.     Mouth/Throat:     Mouth: Mucous membranes are moist.     Pharynx: Oropharynx is clear.  Eyes:     General: No scleral icterus.    Conjunctiva/sclera: Conjunctivae normal.     Pupils: Pupils are equal, round, and reactive to light.     Comments: Bilateral proptosis with medial deviation of the right eye  Cardiovascular:     Rate and Rhythm: Normal rate and regular rhythm.     Pulses: Normal pulses.     Heart sounds: Normal heart sounds. No murmur heard. Pulmonary:     Effort: Pulmonary effort is normal.     Breath sounds: Normal breath sounds. No wheezing, rhonchi or rales.  Abdominal:     General: Abdomen is flat. Bowel sounds are normal. There is no distension.     Palpations: Abdomen is soft.     Tenderness: There is no abdominal tenderness.  Musculoskeletal:        General: No swelling or deformity. Normal range of motion.     Cervical back: Normal range of motion.  Skin:    General: Skin is warm and dry.     Capillary Refill: Capillary refill takes less than 2 seconds.  Neurological:     General: No focal deficit present.     Mental Status: He is alert and oriented to person, place, and time.     Motor: No weakness.  Psychiatric:        Mood and Affect: Mood normal.        Behavior: Behavior normal.        Thought Content: Thought content normal.     Assessment & Plan:   Problem List Items Addressed This Visit       Essential hypertension, benign    Previously documented history of essential hypertension.  He is currently prescribed lisinopril-HCTZ 10-12.5 mg twice daily.  BP today is 130/80. -No medication  changes today      Gastroesophageal reflux disease    Symptoms are currently well-controlled with omeprazole 40 mg daily. -No medication changes today      Hypothyroidism following radioiodine therapy    History of secondary hypothyroidism following RAI in the setting of Graves' disease.  He is currently prescribed levothyroxine 137 mcg daily. -Repeat TFTs ordered today  Cigarette nicotine dependence with nicotine-induced disorder    He continues to smoke 1 pack/day of cigarettes and has been smoking for at least 40 years.  He is currently precontemplative with regards to cessation, but is agreeable to a referral for lung cancer screening. -Lung cancer screening referral placed today      Vitamin D deficiency    Previously documented history of vitamin D deficiency.  He is currently on vitamin D supplementation. -Repeat vitamin D level ordered today      Hyperlipidemia    Currently prescribed atorvastatin 20 mg daily and fish oil supplementation as well. -Repeat lipid panel ordered today      Pre-diabetes    Previously documented history of prediabetes. -Repeat A1c ordered today      Encounter for general adult medical examination with abnormal findings - Primary    Presenting today to establish care.  Available records and labs have been reviewed. -Baseline labs ordered today, including one-time HIV screening -Lung cancer screening referral placed today -Records from his previous provider have been requested -States that he completed Cologuard last fall -We will tentatively plan for follow-up in 6 months      Return in about 6 months (around 01/27/2023).   Billie Lade, MD

## 2022-07-28 ENCOUNTER — Telehealth: Payer: Self-pay | Admitting: Internal Medicine

## 2022-07-28 ENCOUNTER — Other Ambulatory Visit: Payer: Self-pay

## 2022-07-28 LAB — CMP14+EGFR
ALT: 54 IU/L — ABNORMAL HIGH (ref 0–44)
AST: 32 IU/L (ref 0–40)
Albumin/Globulin Ratio: 1.8 (ref 1.2–2.2)
Albumin: 4.8 g/dL (ref 3.8–4.9)
Alkaline Phosphatase: 125 IU/L — ABNORMAL HIGH (ref 44–121)
BUN/Creatinine Ratio: 13 (ref 9–20)
BUN: 13 mg/dL (ref 6–24)
Bilirubin Total: 0.5 mg/dL (ref 0.0–1.2)
CO2: 23 mmol/L (ref 20–29)
Calcium: 10.7 mg/dL — ABNORMAL HIGH (ref 8.7–10.2)
Chloride: 98 mmol/L (ref 96–106)
Creatinine, Ser: 1.04 mg/dL (ref 0.76–1.27)
Globulin, Total: 2.7 g/dL (ref 1.5–4.5)
Glucose: 97 mg/dL (ref 70–99)
Potassium: 4.2 mmol/L (ref 3.5–5.2)
Sodium: 139 mmol/L (ref 134–144)
Total Protein: 7.5 g/dL (ref 6.0–8.5)
eGFR: 85 mL/min/{1.73_m2} (ref >59)

## 2022-07-28 LAB — LIPID PANEL
Chol/HDL Ratio: 5.7 ratio — ABNORMAL HIGH (ref 0.0–5.0)
Cholesterol, Total: 189 mg/dL (ref 100–199)
HDL: 33 mg/dL — ABNORMAL LOW (ref >39)
LDL Chol Calc (NIH): 108 mg/dL — ABNORMAL HIGH (ref 0–99)
Triglycerides: 275 mg/dL — ABNORMAL HIGH (ref 0–149)
VLDL Cholesterol Cal: 48 mg/dL — ABNORMAL HIGH (ref 5–40)

## 2022-07-28 LAB — HEMOGLOBIN A1C
Est. average glucose Bld gHb Est-mCnc: 131 mg/dL
Hgb A1c MFr Bld: 6.2 % — ABNORMAL HIGH (ref 4.8–5.6)

## 2022-07-28 LAB — CBC WITH DIFFERENTIAL/PLATELET
Basophils Absolute: 0.2 10*3/uL (ref 0.0–0.2)
Basos: 2 %
EOS (ABSOLUTE): 0.2 10*3/uL (ref 0.0–0.4)
Eos: 2 %
Hematocrit: 53.7 % — ABNORMAL HIGH (ref 37.5–51.0)
Hemoglobin: 17.9 g/dL — ABNORMAL HIGH (ref 13.0–17.7)
Immature Grans (Abs): 0 10*3/uL (ref 0.0–0.1)
Immature Granulocytes: 0 %
Lymphocytes Absolute: 2.7 10*3/uL (ref 0.7–3.1)
Lymphs: 29 %
MCH: 30.5 pg (ref 26.6–33.0)
MCHC: 33.3 g/dL (ref 31.5–35.7)
MCV: 92 fL (ref 79–97)
Monocytes Absolute: 0.6 10*3/uL (ref 0.1–0.9)
Monocytes: 6 %
Neutrophils Absolute: 5.6 10*3/uL (ref 1.4–7.0)
Neutrophils: 61 %
Platelets: 320 10*3/uL (ref 150–450)
RBC: 5.87 x10E6/uL — ABNORMAL HIGH (ref 4.14–5.80)
RDW: 12.9 % (ref 11.6–15.4)
WBC: 9.2 10*3/uL (ref 3.4–10.8)

## 2022-07-28 LAB — TSH+FREE T4
Free T4: 1.58 ng/dL (ref 0.82–1.77)
TSH: 2.42 u[IU]/mL (ref 0.450–4.500)

## 2022-07-28 LAB — B12 AND FOLATE PANEL
Folate: 2 ng/mL — ABNORMAL LOW (ref >3.0)
Vitamin B-12: 464 pg/mL (ref 232–1245)

## 2022-07-28 LAB — HIV ANTIBODY (ROUTINE TESTING W REFLEX): HIV Screen 4th Generation wRfx: NONREACTIVE

## 2022-07-28 LAB — VITAMIN D 25 HYDROXY (VIT D DEFICIENCY, FRACTURES): Vit D, 25-Hydroxy: 54.6 ng/mL (ref 30.0–100.0)

## 2022-07-28 MED ORDER — FISH OIL 1000 MG PO CAPS: 2.0000 | ORAL_CAPSULE | Freq: Every day | ORAL | 0 refills | Status: AC

## 2022-07-28 MED ORDER — LEVOTHYROXINE SODIUM 137 MCG PO TABS: ORAL_TABLET | ORAL | 0 refills | Status: DC

## 2022-07-28 MED ORDER — LORATADINE 10 MG PO TABS: 10.0000 mg | ORAL_TABLET | Freq: Every day | ORAL | 0 refills | Status: AC

## 2022-07-28 MED ORDER — LISINOPRIL-HYDROCHLOROTHIAZIDE 10-12.5 MG PO TABS: 1.0000 | ORAL_TABLET | Freq: Every day | ORAL | 0 refills | Status: DC

## 2022-07-28 MED ORDER — OMEPRAZOLE 40 MG PO CPDR: 40.0000 mg | DELAYED_RELEASE_CAPSULE | Freq: Every day | ORAL | 0 refills | Status: DC

## 2022-07-28 MED ORDER — ATORVASTATIN CALCIUM 20 MG PO TABS: 20.0000 mg | ORAL_TABLET | Freq: Every day | ORAL | 0 refills | Status: DC

## 2022-07-28 MED ORDER — VITAMIN D3 125 MCG (5000 UT) PO CAPS: 5000.0000 [IU] | ORAL_CAPSULE | Freq: Every day | ORAL | 0 refills | Status: AC

## 2022-07-28 MED ORDER — CETIRIZINE HCL 10 MG PO TABS: 10.0000 mg | ORAL_TABLET | Freq: Every day | ORAL | 0 refills | Status: AC

## 2022-07-28 NOTE — Telephone Encounter (Signed)
Refills sent for pt and mother.

## 2022-07-28 NOTE — Telephone Encounter (Signed)
Pts mother called and said the pharmacy never received  prescription for    omeprazole 40 MG capsule

## 2022-09-14 ENCOUNTER — Other Ambulatory Visit: Payer: Self-pay

## 2022-09-14 DIAGNOSIS — Z87891 Personal history of nicotine dependence: Secondary | ICD-10-CM

## 2022-09-14 DIAGNOSIS — F17210 Nicotine dependence, cigarettes, uncomplicated: Secondary | ICD-10-CM

## 2022-09-29 ENCOUNTER — Ambulatory Visit (INDEPENDENT_AMBULATORY_CARE_PROVIDER_SITE_OTHER): Payer: Medicare Other

## 2022-09-29 VITALS — Ht 71.0 in | Wt 249.0 lb

## 2022-09-29 DIAGNOSIS — Z0000 Encounter for general adult medical examination without abnormal findings: Secondary | ICD-10-CM

## 2022-09-29 NOTE — Patient Instructions (Signed)
[name] , Thank you for taking time to come for your Medicare Wellness Visit. I appreciate your ongoing commitment to your health goals. Please review the following plan we discussed and let me know if I can assist you in the future.   These are the goals we discussed:  Goals       Patient Stated (pt-stated)      Patient would like to lose weight        This is a list of the screening recommended for you and due dates:  Health Maintenance  Topic Date Due   COVID-19 Vaccine (1) Never done   DTaP/Tdap/Td vaccine (1 - Tdap) Never done   Colon Cancer Screening  Never done   Screening for Lung Cancer  Never done   Zoster (Shingles) Vaccine (1 of 2) Never done   Stool Blood Test  12/03/2018   Flu Shot  10/22/2022   Medicare Annual Wellness Visit  09/29/2023   Hepatitis C Screening  Completed   HIV Screening  Completed   HPV Vaccine  Aged Out    Advanced directives: Your Advanced Directives are on file. Those papers are generally updated every 10 years.   Conditions/risks identified: Aim for 30 minutes of exercise or brisk walking, 6-8 glasses of water, and 5 servings of fruits and vegetables each day.  Steps to Quit Smoking Smoking tobacco is the leading cause of preventable death. It can affect almost every organ in the body. Smoking puts you and people around you at risk for many serious, long-lasting (chronic) diseases. Quitting smoking can be hard, but it is one of the best things that you can do for your health. It is never too late to quit. Do not give up if you cannot quit the first time. Some people need to try many times to quit. Do your best to stick to your quit plan, and talk with your doctor if you have any questions or concerns. How do I get ready to quit? Pick a date to quit. Set a date within the next 2 weeks to give you time to prepare. Write down the reasons why you are quitting. Keep this list in places where you will see it often. Tell your family, friends, and  co-workers that you are quitting. Their support is important. Talk with your doctor about the choices that may help you quit. Find out if your health insurance will pay for these treatments. Know the people, places, things, and activities that make you want to smoke (triggers). Avoid them. What first steps can I take to quit smoking? Throw away all cigarettes at home, at work, and in your car. Throw away the things that you use when you smoke, such as ashtrays and lighters. Clean your car. Empty the ashtray. Clean your home, including curtains and carpets. What can I do to help me quit smoking? Talk with your doctor about taking medicines and seeing a counselor. You are more likely to succeed when you do both. If you are pregnant or breastfeeding: Talk with your doctor about counseling or other ways to quit smoking. Do not take medicine to help you quit smoking unless your doctor tells you to. Quit right away Quit smoking completely, instead of slowly cutting back on how much you smoke over a period of time. Stopping smoking right away may be more successful than slowly quitting. Go to counseling. In-person is best if this is an option. You are more likely to quit if you go to counseling  sessions regularly. Take medicine You may take medicines to help you quit. Some medicines need a prescription, and some you can buy over-the-counter. Some medicines may contain a drug called nicotine to replace the nicotine in cigarettes. Medicines may: Help you stop having the desire to smoke (cravings). Help to stop the problems that come when you stop smoking (withdrawal symptoms). Your doctor may ask you to use: Nicotine patches, gum, or lozenges. Nicotine inhalers or sprays. Non-nicotine medicine that you take by mouth. Find resources Find resources and other ways to help you quit smoking and remain smoke-free after you quit. They include: Online chats with a Veterinary surgeon. Phone quitlines. Printed  Materials engineer. Support groups or group counseling. Text messaging programs. Mobile phone apps. Use apps on your mobile phone or tablet that can help you stick to your quit plan. Examples of free services include Quit Guide from the CDC and smokefree.gov  What can I do to make it easier to quit?  Talk to your family and friends. Ask them to support and encourage you. Call a phone quitline, such as 1-800-QUIT-NOW, reach out to support groups, or work with a Veterinary surgeon. Ask people who smoke to not smoke around you. Avoid places that make you want to smoke, such as: Bars. Parties. Smoke-break areas at work. Spend time with people who do not smoke. Lower the stress in your life. Stress can make you want to smoke. Try these things to lower stress: Getting regular exercise. Doing deep-breathing exercises. Doing yoga. Meditating. What benefits will I see if I quit smoking? Over time, you may have: A better sense of smell and taste. Less coughing and sore throat. A slower heart rate. Lower blood pressure. Clearer skin. Better breathing. Fewer sick days. Summary Quitting smoking can be hard, but it is one of the best things that you can do for your health. Do not give up if you cannot quit the first time. Some people need to try many times to quit. When you decide to quit smoking, make a plan to help you succeed. Quit smoking right away, not slowly over a period of time. When you start quitting, get help and support to keep you smoke-free. This information is not intended to replace advice given to you by your health care provider. Make sure you discuss any questions you have with your health care provider. Document Revised: 02/28/2021 Document Reviewed: 02/28/2021 Elsevier Patient Education  2024 [employer]    Next appointment: VIRTUAL/ TELEPHONE VISIT Follow up in one year for your annual wellness visit  November 03, 2023 at 11:30am telephone visit.    Preventive Care 56  Years and Older, Male  Preventive care refers to lifestyle choices and visits with your health care provider that can promote health and wellness. What does preventive care include? A yearly physical exam. This is also called an annual well check. Dental exams once or twice a year. Routine eye exams. Ask your health care provider how often you should have your eyes checked. Personal lifestyle choices, including: Daily care of your teeth and gums. Regular physical activity. Eating a healthy diet. Avoiding tobacco and drug use. Limiting alcohol use. Practicing safe sex. Taking low doses of aspirin every day. Taking vitamin and mineral supplements as recommended by your health care provider. What happens during an annual well check? The services and screenings done by your health care provider during your annual well check will depend on your age, overall health, lifestyle risk factors, and family history of disease. Counseling  Your health care provider may ask you questions about your: Alcohol use. Tobacco use. Drug use. Emotional well-being. Home and relationship well-being. Sexual activity. Eating habits. History of falls. Memory and ability to understand (cognition). Work and work Astronomer. Screening  You may have the following tests or measurements: Height, weight, and BMI. Blood pressure. Lipid and cholesterol levels. These may be checked every 5 years, or more frequently if you are over 72 years old. Skin check. Lung cancer screening. You may have this screening every year starting at age 21 if you have a 30-pack-year history of smoking and currently smoke or have quit within the past 15 years. Fecal occult blood test (FOBT) of the stool. You may have this test every year starting at age 47. Flexible sigmoidoscopy or colonoscopy. You may have a sigmoidoscopy every 5 years or a colonoscopy every 10 years starting at age 33. Prostate cancer screening. Recommendations will  vary depending on your family history and other risks. Hepatitis C blood test. Hepatitis B blood test. Sexually transmitted disease (STD) testing. Diabetes screening. This is done by checking your blood sugar (glucose) after you have not eaten for a while (fasting). You may have this done every 1-3 years. Abdominal aortic aneurysm (AAA) screening. You may need this if you are a current or former smoker. Osteoporosis. You may be screened starting at age 96 if you are at high risk. Talk with your health care provider about your test results, treatment options, and if necessary, the need for more tests. Vaccines  Your health care provider may recommend certain vaccines, such as: Influenza vaccine. This is recommended every year. Tetanus, diphtheria, and acellular pertussis (Tdap, Td) vaccine. You may need a Td booster every 10 years. Zoster vaccine. You may need this after age 85. Pneumococcal 13-valent conjugate (PCV13) vaccine. One dose is recommended after age 59. Pneumococcal polysaccharide (PPSV23) vaccine. One dose is recommended after age 95. Talk to your health care provider about which screenings and vaccines you need and how often you need them. This information is not intended to replace advice given to you by your health care provider. Make sure you discuss any questions you have with your health care provider. Document Released: 04/05/2015 Document Revised: 11/27/2015 Document Reviewed: 01/08/2015 Elsevier Interactive Patient Education  2017 [employer].  Fall Prevention in the Home Falls can cause injuries. They can happen to people of all ages. There are many things you can do to make your home safe and to help prevent falls. What can I do on the outside of my home? Regularly fix the edges of walkways and driveways and fix any cracks. Remove anything that might make you trip as you walk through a door, such as a raised step or threshold. Trim any bushes or trees on the path to  your home. Use bright outdoor lighting. Clear any walking paths of anything that might make someone trip, such as rocks or tools. Regularly check to see if handrails are loose or broken. Make sure that both sides of any steps have handrails. Any raised decks and porches should have guardrails on the edges. Have any leaves, snow, or ice cleared regularly. Use sand or salt on walking paths during winter. Clean up any spills in your garage right away. This includes oil or grease spills. What can I do in the bathroom? Use night lights. Install grab bars by the toilet and in the tub and shower. Do not use towel bars as grab bars. Use non-skid mats or decals  in the tub or shower. If you need to sit down in the shower, use a plastic, non-slip stool. Keep the floor dry. Clean up any water that spills on the floor as soon as it happens. Remove soap buildup in the tub or shower regularly. Attach bath mats securely with double-sided non-slip rug tape. Do not have throw rugs and other things on the floor that can make you trip. What can I do in the bedroom? Use night lights. Make sure that you have a light by your bed that is easy to reach. Do not use any sheets or blankets that are too big for your bed. They should not hang down onto the floor. Have a firm chair that has side arms. You can use this for support while you get dressed. Do not have throw rugs and other things on the floor that can make you trip. What can I do in the kitchen? Clean up any spills right away. Avoid walking on wet floors. Keep items that you use a lot in easy-to-reach places. If you need to reach something above you, use a strong step stool that has a grab bar. Keep electrical cords out of the way. Do not use floor polish or wax that makes floors slippery. If you must use wax, use non-skid floor wax. Do not have throw rugs and other things on the floor that can make you trip. What can I do with my stairs? Do not leave  any items on the stairs. Make sure that there are handrails on both sides of the stairs and use them. Fix handrails that are broken or loose. Make sure that handrails are as long as the stairways. Check any carpeting to make sure that it is firmly attached to the stairs. Fix any carpet that is loose or worn. Avoid having throw rugs at the top or bottom of the stairs. If you do have throw rugs, attach them to the floor with carpet tape. Make sure that you have a light switch at the top of the stairs and the bottom of the stairs. If you do not have them, ask someone to add them for you. What else can I do to help prevent falls? Wear shoes that: Do not have high heels. Have rubber bottoms. Are comfortable and fit you well. Are closed at the toe. Do not wear sandals. If you use a stepladder: Make sure that it is fully opened. Do not climb a closed stepladder. Make sure that both sides of the stepladder are locked into place. Ask someone to hold it for you, if possible. Clearly mark and make sure that you can see: Any grab bars or handrails. First and last steps. Where the edge of each step is. Use tools that help you move around (mobility aids) if they are needed. These include: Canes. Walkers. Scooters. Crutches. Turn on the lights when you go into a dark area. Replace any light bulbs as soon as they burn out. Set up your furniture so you have a clear path. Avoid moving your furniture around. If any of your floors are uneven, fix them. If there are any pets around you, be aware of where they are. Review your medicines with your doctor. Some medicines can make you feel dizzy. This can increase your chance of falling. Ask your doctor what other things that you can do to help prevent falls. This information is not intended to replace advice given to you by your health care provider. Make sure you discuss  any questions you have with your health care provider. Document Released: 01/03/2009  Document Revised: 08/15/2015 Document Reviewed: 04/13/2014 Elsevier Interactive Patient Education  2017 [employer].

## 2022-09-29 NOTE — Progress Notes (Signed)
 Subjective:   Donald Chambers is a 57 y.o. male who presents for Medicare Annual/Subsequent preventive examination.  Visit Complete: Virtual  I connected with  Donald Chambers on 09/29/22 by a audio enabled telemedicine application and verified that I am speaking with the correct person using two identifiers.  Patient Location: Home  Provider Location: Home Office  I discussed the limitations of evaluation and management by telemedicine. The patient expressed understanding and agreed to proceed.  Patient Medicare AWV questionnaire was completed by the patient on n/a; I have confirmed that all information answered by patient is correct and no changes since this date.  Review of Systems     Cardiac Risk Factors include: dyslipidemia;hypertension;male gender;obesity (BMI >30kg/m2);sedentary lifestyle;smoking/ tobacco exposure     Objective:    Today's Vitals   09/29/22 1121  Weight: 249 lb (112.9 kg)  Height: 5\' 11"  (1.803 m)   Body mass index is 34.73 kg/m.     09/29/2022   11:27 AM 03/07/2017   12:37 PM 07/01/2016   11:29 AM  Advanced Directives  Does Patient Have a Medical Advance Directive? Yes No No  Type of Estate agent of [place];Living will    Does patient want to make changes to medical advance directive? No - Patient declined    Copy of Healthcare Power of Attorney in Chart? Yes - validated most recent copy scanned in chart (See row information)      Current Medications (verified) Outpatient Encounter Medications as of 09/29/2022  Medication Sig   atorvastatin (LIPITOR) 20 MG tablet Take 1 tablet (20 mg total) by mouth daily.   cetirizine (ZYRTEC) 10 MG tablet Take 1 tablet (10 mg total) by mouth daily.   Cholecalciferol (VITAMIN D3) 125 MCG (5000 UT) CAPS Take 1 capsule (5,000 Units total) by mouth daily.   levothyroxine (SYNTHROID) 137 MCG tablet TAKE 1 Tablet BY MOUTH ONCE DAILY BEFORE BREAKFAST   lisinopril-hydrochlorothiazide  (ZESTORETIC) 10-12.5 MG tablet Take 1 tablet by mouth daily.   loratadine (CLARITIN) 10 MG tablet Take 1 tablet (10 mg total) by mouth daily.   Omega-3 Fatty Acids (FISH OIL) 1000 MG CAPS Take 2 capsules (2,000 mg total) by mouth daily.   omeprazole (PRILOSEC) 40 MG capsule Take 1 capsule (40 mg total) by mouth daily.   No facility-administered encounter medications on file as of 09/29/2022.    Allergies (verified) Patient has no known allergies.   History: Past Medical History:  Diagnosis Date   GERD (gastroesophageal reflux disease)    Hypercholesteremia    Hypertension    Hyperthyroidism    s/p RAI   History reviewed. No pertinent surgical history. Family History  Problem Relation Age of Onset   Diabetes Father    Hypertension Father    Hypertension Mother    Social History   Socioeconomic History   Marital status: Single    Spouse name: Not on file   Number of children: Not on file   Years of education: Not on file   Highest education level: Not on file  Occupational History   Not on file  Tobacco Use   Smoking status: Every Day    Packs/day: 1.00    Years: 34.00    Additional pack years: 0.00    Total pack years: 34.00    Types: Cigarettes   Smokeless tobacco: Never  Vaping Use   Vaping Use: Never used  Substance and Sexual Activity   Alcohol use: No    Alcohol/week: 0.0 standard drinks  of alcohol   Drug use: No   Sexual activity: Not on file  Other Topics Concern   Not on file  Social History Narrative   Not on file   Social Determinants of Health   Financial Resource Strain: Low Risk  (09/29/2022)   Overall Financial Resource Strain (CARDIA)    Difficulty of Paying Living Expenses: Not hard at all  Food Insecurity: Not on file  Transportation Needs: No Transportation Needs (09/29/2022)   PRAPARE - Administrator, Civil Service (Medical): No    Lack of Transportation (Non-Medical): No  Physical Activity: Inactive (09/29/2022)   Exercise  Vital Sign    Days of Exercise per Week: 0 days    Minutes of Exercise per Session: 0 min  Stress: No Stress Concern Present (09/29/2022)   [employer] of Occupational Health - Occupational Stress Questionnaire    Feeling of Stress : Not at all  Social Connections: Socially Integrated (09/29/2022)   Social Connection and Isolation Panel [NHANES]    Frequency of Communication with Friends and Family: More than three times a week    Frequency of Social Gatherings with Friends and Family: More than three times a week    Attends Religious Services: More than 4 times per year    Active Member of [employer] or Organizations: Yes    Attends Engineer, structural: More than 4 times per year    Marital Status: Married    Tobacco Counseling Ready to quit: Yes Counseling given: Yes   Clinical Intake:  Pre-visit preparation completed: Yes  Pain : No/denies pain     BMI - recorded: 34.73 Nutritional Status: BMI > 30  Obese Nutritional Risks: None Diabetes: No  How often do you need to have someone help you when you read instructions, pamphlets, or other written materials from your doctor or pharmacy?: 1 - Never  Interpreter Needed?: No  Information entered by ::  Marguerite Barba, CMA   Activities of Daily Living    09/29/2022   11:23 AM  In your present state of health, do you have any difficulty performing the following activities:  Hearing? 0  Vision? 0  Difficulty concentrating or making decisions? 0  Walking or climbing stairs? 0  Dressing or bathing? 0  Doing errands, shopping? 0  Preparing Food and eating ? N  Using the Toilet? N  In the past six months, have you accidently leaked urine? N  Do you have problems with loss of bowel control? N  Managing your Medications? N  Managing your Finances? N  Housekeeping or managing your Housekeeping? N    Patient Care Team: Billie Lade, MD as PCP - General (Internal Medicine) West Bali, MD (Inactive) as  Consulting Physician (Gastroenterology)  Indicate any recent Medical Services you may have received from other than [facility] providers in the past year (date may be approximate).     Assessment:   This is a routine wellness examination for Donald Chambers.  Hearing/Vision screen Hearing Screening - Comments:: Patient denies any hearing difficulties.    Dietary issues and exercise activities discussed:     Goals Addressed               This Visit's Progress     Patient Stated (pt-stated)        Patient would like to lose weight       Depression Screen    09/29/2022   11:24 AM 07/27/2022    9:21 AM 08/27/2016    9:23  AM 05/27/2016    8:45 AM 11/27/2015    8:53 AM 05/27/2015    8:25 AM  PHQ 2/9 Scores  PHQ - 2 Score 0 0 0 0 0 0  PHQ- 9 Score  0        Fall Risk    09/29/2022   11:27 AM 07/27/2022    9:21 AM 08/27/2016    9:23 AM 05/27/2016    8:45 AM 11/27/2015    8:53 AM  Fall Risk   Falls in the past year? 0 0 No No No  Number falls in past yr: 0 0     Injury with Fall? 0 0     Risk for fall due to : No Fall Risks No Fall Risks     Follow up Falls prevention discussed Falls evaluation completed       MEDICARE RISK AT HOME:  Medicare Risk at Home - 09/29/22 1127     Any stairs in or around the home? No    If so, are there any without handrails? No    Home free of loose throw rugs in walkways, pet beds, electrical cords, etc? Yes    Adequate lighting in your home to reduce risk of falls? Yes    Life alert? No    Use of a cane, walker or w/c? No    Grab bars in the bathroom? Yes    Shower chair or bench in shower? No    Elevated toilet seat or a handicapped toilet? No             TIMED UP AND GO:  Was the test performed?  No    Cognitive Function:        09/29/2022   11:27 AM  6CIT Screen  What Year? 0 points  What month? 0 points  What time? 0 points  Count back from 20 0 points  Months in reverse 0 points  Repeat phrase 0 points  Total Score 0 points     Immunizations  There is no immunization history on file for this patient.  TDAP status: Due, Education has been provided regarding the importance of this vaccine. Advised may receive this vaccine at local pharmacy or Health Dept. Aware to provide a copy of the vaccination record if obtained from local pharmacy or Health Dept. Verbalized acceptance and understanding.  Flu Vaccine status: Up to date  Pneumococcal vaccine status: NOT AGE APPROPRIATE FOR THIS PATIENT  Covid-19 vaccine status: Information provided on how to obtain vaccines.   Qualifies for Shingles Vaccine? Yes   Zostavax completed No   Shingrix Completed?: No.    Education has been provided regarding the importance of this vaccine. Patient has been advised to call insurance company to determine out of pocket expense if they have not yet received this vaccine. Advised may also receive vaccine at local pharmacy or Health Dept. Verbalized acceptance and understanding.  Screening Tests Health Maintenance  Topic Date Due   COVID-19 Vaccine (1) Never done   DTaP/Tdap/Td (1 - Tdap) Never done   Colonoscopy  Never done   Lung Cancer Screening  Never done   Zoster Vaccines- Shingrix (1 of 2) Never done   COLON CANCER SCREENING ANNUAL FOBT  12/03/2018   Medicare Annual Wellness (AWV)  09/13/2022   INFLUENZA VACCINE  10/22/2022   Hepatitis C Screening  Completed   HIV Screening  Completed   HPV VACCINES  Aged Out    Health Maintenance  Health Maintenance Due  Topic  Date Due   COVID-19 Vaccine (1) Never done   DTaP/Tdap/Td (1 - Tdap) Never done   Colonoscopy  Never done   Lung Cancer Screening  Never done   Zoster Vaccines- Shingrix (1 of 2) Never done   COLON CANCER SCREENING ANNUAL FOBT  12/03/2018   Medicare Annual Wellness (AWV)  09/13/2022    Colorectal Screening: Please request colonoscopy results from Dr. Rene Paci in [place].   Lung Cancer Screening: (Low Dose CT Chest recommended if Age 58-80 years, 20  pack-year currently smoking OR have quit w/in 15years.) does qualify.   Lung Cancer Screening Referral: Patient has an appt for screenin on 10/23/22  Additional Screening:  Hepatitis C Screening: does not qualify; Completed 03/04/2017  Vision Screening: Recommended annual ophthalmology exams for early detection of glaucoma and other disorders of the eye. Is the patient up to date with their annual eye exam?  Yes  Who is the provider or what is the name of the office in which the patient attends annual eye exams? Dr. Waynard Edwards If pt is not established with a provider, would they like to be referred to a provider to establish care? No .   Dental Screening: Recommended annual dental exams for proper oral hygiene  Diabetic Foot Exam: N/A  Community Resource Referral / Chronic Care Management: CRR required this visit?  No   CCM required this visit?  No     Plan:     I have personally reviewed and noted the following in the patient's chart:   Medical and social history Use of alcohol, tobacco or illicit drugs  Current medications and supplements including opioid prescriptions. Patient is not currently taking opioid prescriptions. Functional ability and status Nutritional status Physical activity Advanced directives List of other physicians Hospitalizations, surgeries, and ER visits in previous 12 months Vitals Screenings to include cognitive, depression, and falls Referrals and appointments  In addition, I have reviewed and discussed with patient certain preventive protocols, quality metrics, and best practice recommendations. A written personalized care plan for preventive services as well as general preventive health recommendations were provided to patient.   Any medications not marked as taking were not mentioned by the patient (or their caregiver if applicable) when reconciling the medications.  Because this visit was a virtual/telehealth visit,  certain criteria was not  obtained, such a blood pressure, CBG if patient is a diabetic, and timed up and go.    Jordan Hawks Kahne Helfand, CMA   09/29/2022   After Visit Summary: (Mail) Due to this being a telephonic visit, the after visit summary with patients personalized plan was offered to patient via mail   Nurse Notes: lung cancer screening scheduled for October 23, 2022

## 2022-10-21 ENCOUNTER — Other Ambulatory Visit: Payer: Self-pay | Admitting: Internal Medicine

## 2022-10-21 ENCOUNTER — Ambulatory Visit: Payer: Medicare Other | Admitting: Acute Care

## 2022-10-21 ENCOUNTER — Encounter: Payer: Self-pay | Admitting: Acute Care

## 2022-10-21 DIAGNOSIS — F17210 Nicotine dependence, cigarettes, uncomplicated: Secondary | ICD-10-CM

## 2022-10-21 NOTE — Patient Instructions (Signed)
Thank you for participating in the [facility]. It was our pleasure to meet you today. We will call you with the results of your scan within the next few days. Your scan will be assigned a Lung RADS category score by the physicians reading the scans.  This Lung RADS score determines follow up scanning.  See below for description of categories, and follow up screening recommendations. We will be in touch to schedule your follow up screening annually or based on recommendations of our providers. We will fax a copy of your scan results to your Primary Care Physician, or the physician who referred you to the program, to ensure they have the results. Please call the office if you have any questions or concerns regarding your scanning experience or results.  Our office number is [phone]. Please speak with Abigail Miyamoto, RN. , or  Karlton Lemon RN, They are  our Lung Cancer Screening RN.'s If They are unavailable when you call, Please leave a message on the voice mail. We will return your call at our earliest convenience.This voice mail is monitored several times a day.  Remember, if your scan is normal, we will scan you annually as long as you continue to meet the criteria for the program. (Age 56-80, Current smoker or smoker who has quit within the last 15 years). If you are a smoker, remember, quitting is the single most powerful action that you can take to decrease your risk of lung cancer and other pulmonary, breathing related problems. We know quitting is hard, and we are here to help.  Please let us know if there is anything we can do to help you meet your goal of quitting. If you are a former smoker, Counselling psychologist. We are proud of you! Remain smoke free! Remember you can refer friends or family members through the number above.  We will screen them to make sure they meet criteria for the program. Thank you for helping [place] take better care of you by  participating in Lung Screening.   Lung RADS Categories:  Lung RADS 1: no nodules or definitely non-concerning nodules.  Recommendation is for a repeat annual scan in 12 months.  Lung RADS 2:  nodules that are non-concerning in appearance and behavior with a very low likelihood of becoming an active cancer. Recommendation is for a repeat annual scan in 12 months.  Lung RADS 3: nodules that are probably non-concerning , includes nodules with a low likelihood of becoming an active cancer.  Recommendation is for a 32-month repeat screening scan. Often noted after an upper respiratory illness. We will be in touch to make sure you have no questions, and to schedule your 31-month scan.  Lung RADS 4 A: nodules with concerning findings, recommendation is most often for a follow up scan in 3 months or additional testing based on our provider's assessment of the scan. We will be in touch to make sure you have no questions and to schedule the recommended 3 month follow up scan.  Lung RADS 4 B:  indicates findings that are concerning. We will be in touch with you to schedule additional diagnostic testing based on our provider's  assessment of the scan.  You can receive free nicotine replacement therapy ( patches, gum or mints) by calling 1-800-QUIT NOW. Please call so we can get you on the path to becoming  a non-smoker. I know it is hard, but you can do this!  Other options for assistance in smoking  cessation ( As covered by your insurance benefits)  Hypnosis for smoking cessation  [employer] [phone]  Acupuncture for smoking cessation  [employer] [phone]

## 2022-10-21 NOTE — Progress Notes (Signed)
Virtual Telephone Visit  I connected with Donald Chambers on 10/21/22 at 10:00 AM EDT by a video enabled telemedicine application and verified that I am speaking with the correct person using two identifiers.  Location: Patient:  At home Provider:  [address] [place], [place], [address]    I discussed the limitations of evaluation and management by telemedicine and the availability of in person appointments. The patient expressed understanding and agreed to proceed.   Shared Decision Making Visit Lung Cancer Screening Program [phone])   Eligibility: Age 57 y.o. Pack Years Smoking History Calculation 80 pack year smoking history (# packs/per year x # years smoked) Recent History of coughing up blood  no Unexplained weight loss? no ( >Than 15 pounds within the last 6 months ) Prior History Lung / other cancer no (Diagnosis within the last 5 years already requiring surveillance chest CT Scans). Smoking Status Current Smoker Former Smokers: Years since quit:  NA  Quit Date:  NA  Visit Components: Discussion included one or more decision making aids. yes Discussion included risk/benefits of screening. yes Discussion included potential follow up diagnostic testing for abnormal scans. yes Discussion included meaning and risk of over diagnosis. yes Discussion included meaning and risk of False Positives. yes Discussion included meaning of total radiation exposure. yes  Counseling Included: Importance of adherence to annual lung cancer LDCT screening. yes Impact of comorbidities on ability to participate in the program. yes Ability and willingness to under diagnostic treatment. yes  Smoking Cessation Counseling: Current Smokers:  Discussed importance of smoking cessation. yes Information about tobacco cessation classes and interventions provided to patient. yes Patient provided with "ticket" for LDCT Scan. yes Symptomatic Patient. no  Counseling NA Diagnosis Code:  Tobacco Use Z72.0 Asymptomatic Patient yes  Counseling (Intermediate counseling: > three minutes counseling) [id number] Former Smokers:  Discussed the importance of maintaining cigarette abstinence. yes Diagnosis Code: Personal History of Nicotine Dependence. K16.010 Information about tobacco cessation classes and interventions provided to patient. Yes Patient provided with "ticket" for LDCT Scan. yes Written Order for Lung Cancer Screening with LDCT placed in Epic. Yes (CT Chest Lung Cancer Screening Low Dose W/O CM) [id number] Z12.2-Screening of respiratory organs Z87.891-Personal history of nicotine dependence  I have spent 25 minutes of face to face/ virtual visit   time with  Donald Chambers discussing the risks and benefits of lung cancer screening. We viewed / discussed a power point together that explained in detail the above noted topics. We paused at intervals to allow for questions to be asked and answered to ensure understanding.We discussed that the single most powerful action that he can take to decrease his risk of developing lung cancer is to quit smoking. We discussed whether or not he is ready to commit to setting a quit date. We discussed options for tools to aid in quitting smoking including nicotine replacement therapy, non-nicotine medications, support groups, Quit Smart classes, and behavior modification. We discussed that often times setting smaller, more achievable goals, such as eliminating 1 cigarette a day for a week and then 2 cigarettes a day for a week can be helpful in slowly decreasing the number of cigarettes smoked. This allows for a sense of accomplishment as well as providing a clinical benefit. I provided  him  with smoking cessation  information  with contact information for community resources, classes, free nicotine replacement therapy, and access to mobile apps, text messaging, and on-line smoking cessation help. I have also provided  him  the  office contact information in  the event he needs to contact me, or the screening staff. We discussed the time and location of the scan, and that either Abigail Miyamoto RN, Karlton Lemon, RN  or I will call / send a letter with the results within 24-72 hours of receiving them. The patient verbalized understanding of all of  the above and had no further questions upon leaving the office. They have my contact information in the event they have any further questions.  I spent 3-4 minutes counseling on smoking cessation and the health risks of continued tobacco abuse.  I explained to the patient that there has been a high incidence of coronary artery disease noted on these exams. I explained that this is a non-gated exam therefore degree or severity cannot be determined. This patient is on statin therapy. I have asked the patient to follow-up with their PCP regarding any incidental finding of coronary artery disease and management with diet or medication as their PCP  feels is clinically indicated. The patient verbalized understanding of the above and had no further questions upon completion of the visit.      Bevelyn Ngo, NP 10/21/2022

## 2022-10-23 ENCOUNTER — Ambulatory Visit (HOSPITAL_COMMUNITY)
Admission: RE | Admit: 2022-10-23 | Discharge: 2022-10-23 | Disposition: A | Discharge status: Home / Self Care | Payer: Medicare Other | Source: Ambulatory Visit | Attending: Acute Care | Admitting: Acute Care

## 2022-10-23 DIAGNOSIS — F17210 Nicotine dependence, cigarettes, uncomplicated: Secondary | ICD-10-CM | POA: Insufficient documentation

## 2022-10-23 DIAGNOSIS — Z87891 Personal history of nicotine dependence: Secondary | ICD-10-CM

## 2022-10-27 ENCOUNTER — Other Ambulatory Visit: Payer: Self-pay

## 2022-10-27 ENCOUNTER — Telehealth: Payer: Self-pay | Admitting: Internal Medicine

## 2022-10-27 MED ORDER — LISINOPRIL-HYDROCHLOROTHIAZIDE 10-12.5 MG PO TABS: 1.0000 | ORAL_TABLET | Freq: Two times a day (BID) | ORAL | 0 refills | Status: DC

## 2022-10-27 NOTE — Telephone Encounter (Signed)
Patient came by office and brought his bottles. Medicine lisinopril-hydrochlorothiazide (ZESTORETIC) 10-12.5 MG table  only has enough for 45 days of this medication usually get supply for 90 days. But bottle says 1 pill. Per patient take 1 morning and 1 night. NEED TO UPDATE TO READ BID AND  TAKE 1 MORNING AND 1 AT NIGHT  Refill:  lisinopril-hydrochlorothiazide (ZESTORETIC) 10-12.5 MG table   Pharmacy: [facility] [place]

## 2022-10-27 NOTE — Telephone Encounter (Signed)
Refill sent. Patient aware.  

## 2022-10-29 ENCOUNTER — Telehealth: Payer: Self-pay | Admitting: Acute Care

## 2022-10-29 DIAGNOSIS — R911 Solitary pulmonary nodule: Secondary | ICD-10-CM

## 2022-10-29 DIAGNOSIS — Z87891 Personal history of nicotine dependence: Secondary | ICD-10-CM

## 2022-10-29 NOTE — Telephone Encounter (Addendum)
Called and spoke to patient regarding recent LDCT results. These results showed a new 7.53mm nodule with recommendations to have a repeat scan in 6 months to re-evaluate. Discussed these findings with the patient and the recommendations. Patient verbalized understanding. Patient's questions were answered. Order placed for repeat scan. Results and plan sent to PCP.   Called radiology back and spoke with Gabe to confirm the call report.    IMPRESSION: 1. Lung-RADS 3, probably benign findings. 7.6 mm left lower lobe pulmonary nodule. Short-term follow-up in 6 months is recommended with repeat low-dose chest CT without contrast (please use the following order, "CT CHEST LCS NODULE FOLLOW-UP W/O CM"). 2. Aortic Atherosclerosis (ICD10-I70.0) and Emphysema (ICD10-J43.9).

## 2022-10-29 NOTE — Telephone Encounter (Signed)
Call Report  

## 2022-10-30 NOTE — Telephone Encounter (Signed)
See other telephone note from 10/29/22

## 2022-12-07 ENCOUNTER — Telehealth (INDEPENDENT_AMBULATORY_CARE_PROVIDER_SITE_OTHER): Payer: Medicare Other | Admitting: Internal Medicine

## 2022-12-07 ENCOUNTER — Ambulatory Visit: Payer: Self-pay

## 2022-12-07 ENCOUNTER — Encounter: Payer: Self-pay | Admitting: Internal Medicine

## 2022-12-07 ENCOUNTER — Telehealth: Payer: Self-pay | Admitting: Internal Medicine

## 2022-12-07 DIAGNOSIS — J0100 Acute maxillary sinusitis, unspecified: Secondary | ICD-10-CM | POA: Insufficient documentation

## 2022-12-07 MED ORDER — AZITHROMYCIN 250 MG PO TABS: ORAL_TABLET | ORAL | 0 refills | Status: AC

## 2022-12-07 MED ORDER — NOREL AD 4-10-325 MG PO TABS: 1.0000 | ORAL_TABLET | Freq: Three times a day (TID) | ORAL | 0 refills | Status: AC | PRN

## 2022-12-07 MED ORDER — PROMETHAZINE-DM 6.25-15 MG/5ML PO SYRP: 5.0000 mL | ORAL_SOLUTION | Freq: Four times a day (QID) | ORAL | 0 refills | Status: DC | PRN

## 2022-12-07 NOTE — Assessment & Plan Note (Signed)
Started empiric azithromycin since he has persistent symptoms despite symptomatic treatment (Coricidin) and considering his emphysema Norel AD as needed for nasal congestion Promethazine DM syrup as needed for cough Advised to use nasal saline spray as needed for nasal congestion Needs to avoid smoking

## 2022-12-07 NOTE — Telephone Encounter (Signed)
Patient scheduled today with dr patel

## 2022-12-07 NOTE — Progress Notes (Signed)
Virtual Visit via Video Note   Because of Donald Chambers's co-morbid illnesses, he is at least at moderate risk for complications without adequate follow up.  This format is felt to be most appropriate for this patient at this time.  All issues noted in this document were discussed and addressed.  A limited physical exam was performed with this format.      Evaluation Performed:  Follow-up visit  Date:  12/07/2022   ID:  Donald Chambers, DOB [birth year], MRN [id number]  Patient Location: Home Provider Location: Office/Clinic  Participants: Patient Location of Patient: Home Location of Provider: Telehealth Consent was obtain for visit to be over via telehealth. I verified that I am speaking with the correct person using two identifiers.  PCP:  Billie Lade, MD   Chief Complaint: Cough, nasal congestion and facial pain  History of Present Illness:    Donald Chambers is a 57 y.o. male who has a video visit for complaint of cough, nasal congestion and left-sided facial pain for the last 4 days.  He reports cough with yellowish expectoration.  He has had low-grade fever, but denies chills.  Denies recent sick contacts.  Denies any recent worsening of dyspnea or wheezing.  He was recently found to have emphysema on CT of chest.  The patient does not have symptoms concerning for COVID-19 infection (fever, chills, cough, or new shortness of breath).   Past Medical, Surgical, Social History, Allergies, and Medications have been Reviewed.  Past Medical History:  Diagnosis Date   GERD (gastroesophageal reflux disease)    Hypercholesteremia    Hypertension    Hyperthyroidism    s/p RAI   No past surgical history on file.   No outpatient medications have been marked as taking for the 12/07/22 encounter (Appointment) with Anabel Halon, MD.     Allergies:   Patient has no known allergies.   ROS:   Please see the history of present illness.     All other systems  reviewed and are negative.   Labs/Other Tests and Data Reviewed:    Recent Labs: 07/27/2022: ALT 54; BUN 13; Creatinine, Ser 1.04; Hemoglobin 17.9; Platelets 320; Potassium 4.2; Sodium 139; TSH 2.420   Recent Lipid Panel Lab Results  Component Value Date/Time   CHOL 189 07/27/2022 10:28 AM   TRIG 275 (H) 07/27/2022 10:28 AM   HDL 33 (L) 07/27/2022 10:28 AM   CHOLHDL 5.7 (H) 07/27/2022 10:28 AM   CHOLHDL 6.1 09/27/2018 08:10 AM   LDLCALC 108 (H) 07/27/2022 10:28 AM    Wt Readings from Last 3 Encounters:  09/29/22 249 lb (112.9 kg)  07/27/22 253 lb 3.2 oz (114.9 kg)  04/04/18 252 lb (114.3 kg)     Objective:    Vital Signs:  There were no vitals taken for this visit.   VITAL SIGNS:  reviewed GEN:  no acute distress EYES:  sclerae anicteric, EOMI - Extraocular Movements Intact RESPIRATORY:  normal respiratory effort, symmetric expansion NEURO:  alert and oriented x 3, no obvious focal deficit PSYCH:  normal affect  ASSESSMENT & PLAN:    Acute non-recurrent maxillary sinusitis Started empiric azithromycin since he has persistent symptoms despite symptomatic treatment (Coricidin) and considering his emphysema Norel AD as needed for nasal congestion Promethazine DM syrup as needed for cough Advised to use nasal saline spray as needed for nasal congestion Needs to avoid smoking    I discussed the assessment and treatment plan with the patient. The  patient was provided an opportunity to ask questions, and all were answered. The patient agreed with the plan and demonstrated an understanding of the instructions.   The patient was advised to call back or seek an in-person evaluation if the symptoms worsen or if the condition fails to improve as anticipated.  The above assessment and management plan was discussed with the patient. The patient verbalized understanding of and has agreed to the management plan.   Medication Adjustments/Labs and Tests Ordered: Current medicines  are reviewed at length with the patient today.  Concerns regarding medicines are outlined above.   Tests Ordered: No orders of the defined types were placed in this encounter.   Medication Changes: No orders of the defined types were placed in this encounter.    Note: This dictation was prepared with Dragon dictation along with smaller phrase technology. Similar sounding words can be transcribed inadequately or may not be corrected upon review. Any transcriptional errors that result from this process are unintentional.      Disposition:  Follow up  Signed, Anabel Halon, MD  12/07/2022 1:00 PM     [facility]

## 2022-12-07 NOTE — Telephone Encounter (Signed)
Patient mother calling says pt is having a possible sinus infection- face swollen, low grade fever, pain in his face. Wishing to speak with a nurse, please advise Thank you

## 2023-01-19 ENCOUNTER — Other Ambulatory Visit: Payer: Self-pay | Admitting: Internal Medicine

## 2023-01-22 ENCOUNTER — Other Ambulatory Visit: Payer: Self-pay

## 2023-01-22 ENCOUNTER — Telehealth: Payer: Self-pay | Admitting: Internal Medicine

## 2023-01-22 ENCOUNTER — Other Ambulatory Visit: Payer: Self-pay | Admitting: Internal Medicine

## 2023-01-22 MED ORDER — LISINOPRIL-HYDROCHLOROTHIAZIDE 10-12.5 MG PO TABS: 1.0000 | ORAL_TABLET | Freq: Two times a day (BID) | ORAL | 2 refills | Status: DC

## 2023-01-22 NOTE — Telephone Encounter (Signed)
Patient is needing Rx for lisinopril-hydrochlorothiazide (ZESTORETIC) 10-12.5 MG tablet [[id number]] for 1 twice a day instead of once a day says he is having to refill after 45 days because Rx was wrong. Needing refill today [facility] in [place]  Thank you

## 2023-01-22 NOTE — Telephone Encounter (Signed)
Refill sent.

## 2023-01-27 ENCOUNTER — Encounter: Payer: Self-pay | Admitting: Internal Medicine

## 2023-01-27 ENCOUNTER — Ambulatory Visit (INDEPENDENT_AMBULATORY_CARE_PROVIDER_SITE_OTHER): Payer: Medicare Other | Admitting: Internal Medicine

## 2023-01-27 VITALS — BP 121/82 | HR 82 | Ht 71.0 in | Wt 248.6 lb

## 2023-01-27 DIAGNOSIS — Z23 Encounter for immunization: Secondary | ICD-10-CM | POA: Insufficient documentation

## 2023-01-27 DIAGNOSIS — I10 Essential (primary) hypertension: Secondary | ICD-10-CM

## 2023-01-27 DIAGNOSIS — E782 Mixed hyperlipidemia: Secondary | ICD-10-CM | POA: Diagnosis not present

## 2023-01-27 DIAGNOSIS — F17219 Nicotine dependence, cigarettes, with unspecified nicotine-induced disorders: Secondary | ICD-10-CM

## 2023-01-27 DIAGNOSIS — E538 Deficiency of other specified B group vitamins: Secondary | ICD-10-CM | POA: Diagnosis not present

## 2023-01-27 DIAGNOSIS — R7303 Prediabetes: Secondary | ICD-10-CM | POA: Diagnosis not present

## 2023-01-27 DIAGNOSIS — Z9189 Other specified personal risk factors, not elsewhere classified: Secondary | ICD-10-CM | POA: Insufficient documentation

## 2023-01-27 DIAGNOSIS — J310 Chronic rhinitis: Secondary | ICD-10-CM | POA: Insufficient documentation

## 2023-01-27 MED ORDER — LISINOPRIL-HYDROCHLOROTHIAZIDE 10-12.5 MG PO TABS: 1.0000 | ORAL_TABLET | Freq: Two times a day (BID) | ORAL | 1 refills | Status: DC

## 2023-01-27 MED ORDER — ATORVASTATIN CALCIUM 40 MG PO TABS
40.0000 mg | ORAL_TABLET | Freq: Every day | ORAL | 3 refills | Status: DC
Start: 2023-01-27 — End: 2023-07-30

## 2023-01-27 MED ORDER — METFORMIN HCL ER 500 MG PO TB24: 500.0000 mg | ORAL_TABLET | Freq: Two times a day (BID) | ORAL | 1 refills | Status: DC

## 2023-01-27 NOTE — Assessment & Plan Note (Signed)
Noted on previous labs.  He reports daily compliance with folic acid supplementation.

## 2023-01-27 NOTE — Patient Instructions (Signed)
It was a pleasure to see you today.  Thank you for giving [place] the opportunity to be involved in your care.  Below is a brief recap of your visit and next steps.  We will plan to see you again in 6 months.  Summary Start metformin 500 mg twice daily Increase atorvastatin to 40 mg daily Flu shot today Sleep medicine referral for sleep apnea screening

## 2023-01-27 NOTE — Assessment & Plan Note (Signed)
 Remains adequately controlled on current antihypertensive regimen.  No medication changes are indicated today.

## 2023-01-27 NOTE — Assessment & Plan Note (Signed)
His acute concern today is persistent nasal congestion that has not improved despite use of OTC medications.  I recommended trying fluticasone nasal spray in addition to continuing use of a daily antihistamine and regular nasal saline rinses.

## 2023-01-27 NOTE — Progress Notes (Signed)
Established Patient Office Visit  Subjective   Patient ID: Donald Chambers, male    DOB: 06/16/1965  Age: 57 y.o. MRN: [id number]  Chief Complaint  Patient presents with   Hypertension    Six month follow up    Allergies    Allergies, wants a medication , OTC are not working    [name] returns to care today for routine follow-up.  He was last evaluated by me on 5/6 as a new patient presenting to establish care.  No medication changes were made at that time, baseline labs were ordered, he was referred for lung cancer screening, and 2-month follow-up was arranged.  In the interim, he has undergone lung cancer screening with CT chest in August.  Repeat imaging is recommended for 6 months.  Virtual visit on 9/16 in the setting of sinus congestion.  Treated for COVID-19.    [name] endorses persistent nasal congestion today.  Symptoms have not improved despite use of Zyrtec, Claritin, and Benadryl.  He is interested in additional treatment options.  He does not have any additional concerns to discuss.  Past Medical History:  Diagnosis Date   GERD (gastroesophageal reflux disease)    Hypercholesteremia    Hypertension    Hyperthyroidism    s/p RAI   History reviewed. No pertinent surgical history. Social History   Tobacco Use   Smoking status: Former    Current packs/day: 2.00    Average packs/day: 2.0 packs/day for 40.0 years (80.0 ttl pk-yrs)    Types: Cigarettes   Smokeless tobacco: Never   Tobacco comments:    2 packs daily x 40 years. Updated for lung cancer screening qualification   Vaping Use   Vaping status: Never Used  Substance Use Topics   Alcohol use: No    Alcohol/week: 0.0 standard drinks of alcohol   Drug use: No   Family History  Problem Relation Age of Onset   Diabetes Father    Hypertension Father    Hypertension Mother    No Known Allergies  Review of Systems  HENT:  Positive for congestion.   All other systems reviewed and are negative.     Objective:     BP 121/82 (BP Location: Left Arm, Patient Position: Sitting, Cuff Size: Large)   Pulse 82   Ht 5\' 11"  (1.803 m)   Wt 248 lb 9.6 oz (112.8 kg)   SpO2 95%   BMI 34.67 kg/m  BP Readings from Last 3 Encounters:  01/27/23 121/82  07/27/22 130/80  04/04/18 116/80   Physical Exam Vitals reviewed.  Constitutional:      General: He is not in acute distress.    Appearance: Normal appearance. He is obese. He is not ill-appearing.  HENT:     Head: Normocephalic and atraumatic.     Right Ear: External ear normal.     Left Ear: External ear normal.     Nose: Nose normal. No congestion or rhinorrhea.     Mouth/Throat:     Mouth: Mucous membranes are moist.     Pharynx: Oropharynx is clear.  Eyes:     General: No scleral icterus.    Conjunctiva/sclera: Conjunctivae normal.     Pupils: Pupils are equal, round, and reactive to light.     Comments: Bilateral proptosis with medial deviation of the right eye  Cardiovascular:     Rate and Rhythm: Normal rate and regular rhythm.     Pulses: Normal pulses.     Heart sounds:  Normal heart sounds. No murmur heard. Pulmonary:     Effort: Pulmonary effort is normal.     Breath sounds: Normal breath sounds. No wheezing, rhonchi or rales.  Abdominal:     General: Abdomen is flat. Bowel sounds are normal. There is no distension.     Palpations: Abdomen is soft.     Tenderness: There is no abdominal tenderness.  Musculoskeletal:        General: No swelling or deformity. Normal range of motion.     Cervical back: Normal range of motion.  Skin:    General: Skin is warm and dry.     Capillary Refill: Capillary refill takes less than 2 seconds.  Neurological:     General: No focal deficit present.     Mental Status: He is alert and oriented to person, place, and time.     Motor: No weakness.  Psychiatric:        Mood and Affect: Mood normal.        Behavior: Behavior normal.        Thought Content: Thought content normal.    Last CBC Lab Results  Component Value Date   WBC 9.2 07/27/2022   HGB 17.9 (H) 07/27/2022   HCT 53.7 (H) 07/27/2022   MCV 92 07/27/2022   MCH 30.5 07/27/2022   RDW 12.9 07/27/2022   PLT 320 07/27/2022   Last metabolic panel Lab Results  Component Value Date   GLUCOSE 97 07/27/2022   NA 139 07/27/2022   K 4.2 07/27/2022   CL 98 07/27/2022   CO2 23 07/27/2022   BUN 13 07/27/2022   CREATININE 1.04 07/27/2022   EGFR 85 07/27/2022   CALCIUM 10.7 (H) 07/27/2022   PROT 7.5 07/27/2022   ALBUMIN 4.8 07/27/2022   LABGLOB 2.7 07/27/2022   AGRATIO 1.8 07/27/2022   BILITOT 0.5 07/27/2022   ALKPHOS 125 (H) 07/27/2022   AST 32 07/27/2022   ALT 54 (H) 07/27/2022   ANIONGAP 9 09/27/2018   Last lipids Lab Results  Component Value Date   CHOL 189 07/27/2022   HDL 33 (L) 07/27/2022   LDLCALC 108 (H) 07/27/2022   TRIG 275 (H) 07/27/2022   CHOLHDL 5.7 (H) 07/27/2022   Last hemoglobin A1c Lab Results  Component Value Date   HGBA1C 6.2 (H) 07/27/2022   Last thyroid functions Lab Results  Component Value Date   TSH 2.420 07/27/2022   Last vitamin D Lab Results  Component Value Date   VD25OH 54.6 07/27/2022   Last vitamin B12 and Folate Lab Results  Component Value Date   VITAMINB12 464 07/27/2022   FOLATE 2.0 (L) 07/27/2022    The 10-year ASCVD risk score (Arnett DK, et al., 2019) is: 15.3%    Assessment & Plan:   Problem List Items Addressed This Visit       Essential hypertension, benign - Primary    Remains adequately controlled on current antihypertensive regimen.  No medication changes are indicated today.      Chronic rhinitis    His acute concern today is persistent nasal congestion that has not improved despite use of OTC medications.  I recommended trying fluticasone nasal spray in addition to continuing use of a daily antihistamine and regular nasal saline rinses.      Cigarette nicotine dependence with nicotine-induced disorder    Continues to  smoke 1 pack/day of cigarettes and remains precontemplative with regards to cessation.  He is currently enrolled in the lung cancer screening program.  Underwent CT  chest in August.  Lung RADS 3.  Repeat imaging recommended for 6 months.      Hyperlipidemia    Lipid panel updated in May.  Total cholesterol 189 and LDL 108.  He is currently prescribed atorvastatin 20 mg daily and fish oil supplementation. -Increase atorvastatin to 40 mg daily      Pre-diabetes    A1c 6.2 on labs from May.  Lifestyle modifications aimed at weight loss and improving his blood sugar were reviewed today.  We also discussed medication options such as Ozempic and metformin.  He prefers to start metformin.   -Start metformin 500 mg twice daily -Repeat A1c at follow-up in 6 months.      Folate deficiency    Noted on previous labs.  He reports daily compliance with folic acid supplementation.      At risk for obstructive sleep apnea    Sleep medicine referral placed today for OSA evaluation due to multiple risk factors for OSA (nightly snoring, obesity, elevated H&H, etc.).      Need for influenza vaccination    Influenza vaccine administered today      Return in about 6 months (around 07/27/2023) for CPE.   Billie Lade, MD

## 2023-01-27 NOTE — Assessment & Plan Note (Signed)
Sleep medicine referral placed today for OSA evaluation due to multiple risk factors for OSA (nightly snoring, obesity, elevated H&H, etc.).

## 2023-01-27 NOTE — Assessment & Plan Note (Signed)
A1c 6.2 on labs from May.  Lifestyle modifications aimed at weight loss and improving his blood sugar were reviewed today.  We also discussed medication options such as Ozempic and metformin.  He prefers to start metformin.   -Start metformin 500 mg twice daily -Repeat A1c at follow-up in 6 months.

## 2023-01-27 NOTE — Assessment & Plan Note (Signed)
Lipid panel updated in May.  Total cholesterol 189 and LDL 108.  He is currently prescribed atorvastatin 20 mg daily and fish oil supplementation. -Increase atorvastatin to 40 mg daily

## 2023-01-27 NOTE — Assessment & Plan Note (Signed)
Continues to smoke 1 pack/day of cigarettes and remains precontemplative with regards to cessation.  He is currently enrolled in the lung cancer screening program.  Underwent CT chest in August.  Lung RADS 3.  Repeat imaging recommended for 6 months.

## 2023-01-27 NOTE — Assessment & Plan Note (Signed)
Influenza vaccine administered today.

## 2023-02-25 ENCOUNTER — Other Ambulatory Visit: Payer: Self-pay | Admitting: Internal Medicine

## 2023-02-25 DIAGNOSIS — I10 Essential (primary) hypertension: Secondary | ICD-10-CM

## 2023-03-31 ENCOUNTER — Institutional Professional Consult (permissible substitution) (HOSPITAL_BASED_OUTPATIENT_CLINIC_OR_DEPARTMENT_OTHER): Payer: Medicare Other | Admitting: Pulmonary Disease

## 2023-04-23 ENCOUNTER — Other Ambulatory Visit: Payer: Self-pay | Admitting: Internal Medicine

## 2023-05-03 ENCOUNTER — Ambulatory Visit (HOSPITAL_COMMUNITY): Admission: RE | Admit: 2023-05-03 | Payer: Medicare Other | Source: Ambulatory Visit

## 2023-07-21 ENCOUNTER — Other Ambulatory Visit: Payer: Self-pay | Admitting: Internal Medicine

## 2023-07-29 ENCOUNTER — Encounter: Payer: Self-pay | Admitting: Internal Medicine

## 2023-07-29 ENCOUNTER — Ambulatory Visit (INDEPENDENT_AMBULATORY_CARE_PROVIDER_SITE_OTHER): Payer: Medicare Other | Admitting: Internal Medicine

## 2023-07-29 VITALS — BP 132/84 | HR 80 | Ht 71.0 in | Wt 254.0 lb

## 2023-07-29 DIAGNOSIS — Z6835 Body mass index (BMI) 35.0-35.9, adult: Secondary | ICD-10-CM

## 2023-07-29 DIAGNOSIS — E782 Mixed hyperlipidemia: Secondary | ICD-10-CM

## 2023-07-29 DIAGNOSIS — I10 Essential (primary) hypertension: Secondary | ICD-10-CM

## 2023-07-29 DIAGNOSIS — R7303 Prediabetes: Secondary | ICD-10-CM | POA: Diagnosis not present

## 2023-07-29 DIAGNOSIS — E66812 Obesity, class 2: Secondary | ICD-10-CM

## 2023-07-29 DIAGNOSIS — E890 Postprocedural hypothyroidism: Secondary | ICD-10-CM | POA: Diagnosis not present

## 2023-07-29 DIAGNOSIS — E559 Vitamin D deficiency, unspecified: Secondary | ICD-10-CM | POA: Diagnosis not present

## 2023-07-29 DIAGNOSIS — F17219 Nicotine dependence, cigarettes, with unspecified nicotine-induced disorders: Secondary | ICD-10-CM | POA: Diagnosis not present

## 2023-07-29 DIAGNOSIS — Z0001 Encounter for general adult medical examination with abnormal findings: Secondary | ICD-10-CM

## 2023-07-29 DIAGNOSIS — E538 Deficiency of other specified B group vitamins: Secondary | ICD-10-CM

## 2023-07-29 DIAGNOSIS — Z125 Encounter for screening for malignant neoplasm of prostate: Secondary | ICD-10-CM

## 2023-07-29 NOTE — Assessment & Plan Note (Signed)
 Annual physical completed today.  Previous records and labs reviewed. -Repeat labs ordered today -Will assist with rescheduling follow-up CT chest -We will tentatively plan for follow-up in 6 months

## 2023-07-29 NOTE — Assessment & Plan Note (Signed)
 He currently smokes 2 packs/day of cigarettes and remains precontemplative with regards to cessation.  CT chest for lung screening last completed in August 2024.  Lung RADS 3.  Repeat imaging recommended for 6 months.  He missed the follow-up CT chest.  Will assist with rescheduling today.

## 2023-07-29 NOTE — Telephone Encounter (Addendum)
 Called patient to schedule 6 month f/u scan. VM full. No mychart, will continue to outreach patient until reached.

## 2023-07-29 NOTE — Telephone Encounter (Signed)
 Patient seen by PCP today and was inquiring about repeat CT scan. States no one has contacted him to schedule 16mo repeat scan.   Please advise, thanks!

## 2023-07-29 NOTE — Assessment & Plan Note (Signed)
 Remains adequately controlled on current antihypertensive regimen.  No medication changes are indicated today.

## 2023-07-29 NOTE — Patient Instructions (Signed)
 It was a pleasure to see you today.  Thank you for giving us  the opportunity to be involved in your care.  Below is a brief recap of your visit and next steps.  We will plan to see you again in 6 months.  Summary Annual physical completed today Repeat labs ordered Will reschedule lung cancer CT Follow up in 6 months

## 2023-07-29 NOTE — Assessment & Plan Note (Signed)
 A1c 6.2 on previous labs.  Metformin  was prescribed but he endorses intolerance.  He is not interested in injectable options.  Repeat A1c ordered today

## 2023-07-29 NOTE — Assessment & Plan Note (Signed)
 He is currently prescribed levothyroxine  137 mcg daily.  Repeat thyroid  studies ordered today.

## 2023-07-29 NOTE — Progress Notes (Signed)
 Complete physical exam  Patient: Donald Chambers   DOB: 1966/02/28   58 y.o. Male  MRN: [id number]  Subjective:     Chief Complaint  Patient presents with   Annual Exam    Donald Chambers is a 58 y.o. male who presents today for a complete physical exam. He reports consuming a general diet. The patient does not participate in regular exercise at present. He generally feels well. He reports sleeping well. He does not have additional problems to discuss today.    Most recent fall risk assessment:    01/27/2023    1:14 PM  Fall Risk   Falls in the past year? 0  Number falls in past yr: 0  Injury with Fall? 0  Risk for fall due to : No Fall Risks  Follow up Falls evaluation completed     Most recent depression screenings:    07/29/2023    2:41 PM 01/27/2023    1:14 PM  PHQ 2/9 Scores  PHQ - 2 Score 0 0  PHQ- 9 Score 0     Vision:Not within last year  and Dental: No current dental problems and No regular dental care   Past Medical History:  Diagnosis Date   GERD (gastroesophageal reflux disease)    Hypercholesteremia    Hypertension    Hyperthyroidism    s/p RAI   No past surgical history on file. Social History   Tobacco Use   Smoking status: Former    Current packs/day: 2.00    Average packs/day: 2.0 packs/day for 40.0 years (80.0 ttl pk-yrs)    Types: Cigarettes   Smokeless tobacco: Never   Tobacco comments:    2 packs daily x 40 years. Updated for lung cancer screening qualification   Vaping Use   Vaping status: Never Used  Substance Use Topics   Alcohol use: No    Alcohol/week: 0.0 standard drinks of alcohol   Drug use: No   Family History  Problem Relation Age of Onset   Diabetes Father    Hypertension Father    Hypertension Mother    No Known Allergies    Patient Care Team: Tobi Fortes, MD as PCP - General (Internal Medicine) Alyce Jubilee, MD (Inactive) as Consulting Physician (Gastroenterology)   Outpatient Medications Prior to  Visit  Medication Sig   atorvastatin  (LIPITOR) 40 MG tablet Take 1 tablet (40 mg total) by mouth daily.   cetirizine  (ZYRTEC ) 10 MG tablet Take 1 tablet (10 mg total) by mouth daily.   Chlorphen-PE-Acetaminophen (NOREL AD) 4-10-325 MG TABS Take 1 tablet by mouth 3 (three) times daily as needed.   Cholecalciferol (VITAMIN D3) 125 MCG (5000 UT) CAPS Take 1 capsule (5,000 Units total) by mouth daily.   levothyroxine  (SYNTHROID ) 137 MCG tablet TAKE 1 Tablet BY MOUTH ONCE DAILY BEFORE BREAKFAST   lisinopril -hydrochlorothiazide  (ZESTORETIC ) 20-25 MG tablet Take 1 tablet by mouth daily.   loratadine  (CLARITIN ) 10 MG tablet Take 1 tablet (10 mg total) by mouth daily.   Omega-3 Fatty Acids (FISH OIL ) 1000 MG CAPS Take 2 capsules (2,000 mg total) by mouth daily.   omeprazole  (PRILOSEC) 40 MG capsule Take 1 capsule by mouth once daily   promethazine -dextromethorphan (PROMETHAZINE -DM) 6.25-15 MG/5ML syrup Take 5 mLs by mouth 4 (four) times daily as needed for cough.   [DISCONTINUED] metFORMIN  (GLUCOPHAGE -XR) 500 MG 24 hr tablet Take 1 tablet (500 mg total) by mouth 2 (two) times daily with a meal.   No facility-administered medications prior  to visit.   Review of Systems  Constitutional:  Negative for chills and fever.  HENT:  Negative for sore throat.   Respiratory:  Negative for cough and shortness of breath.   Cardiovascular:  Negative for chest pain, palpitations and leg swelling.  Gastrointestinal:  Negative for abdominal pain, blood in stool, constipation, diarrhea, nausea and vomiting.  Genitourinary:  Negative for dysuria and hematuria.  Musculoskeletal:  Negative for myalgias.  Skin:  Negative for itching and rash.  Neurological:  Negative for dizziness and headaches.  Psychiatric/Behavioral:  Negative for depression and suicidal ideas.       Objective:     BP 132/84   Pulse 80   Ht 5\' 11"  (1.803 m)   Wt 254 lb (115.2 kg)   SpO2 93%   BMI 35.43 kg/m  BP Readings from Last 3  Encounters:  07/29/23 132/84  01/27/23 121/82  07/27/22 130/80   Physical Exam Vitals reviewed.  Constitutional:      General: He is not in acute distress.    Appearance: Normal appearance. He is obese. He is not ill-appearing.  HENT:     Head: Normocephalic and atraumatic.     Right Ear: External ear normal.     Left Ear: External ear normal.     Nose: Nose normal. No congestion or rhinorrhea.     Mouth/Throat:     Mouth: Mucous membranes are moist.     Pharynx: Oropharynx is clear.  Eyes:     General: No scleral icterus.    Extraocular Movements: Extraocular movements intact.     Conjunctiva/sclera: Conjunctivae normal.     Pupils: Pupils are equal, round, and reactive to light.  Cardiovascular:     Rate and Rhythm: Normal rate and regular rhythm.     Pulses: Normal pulses.     Heart sounds: Normal heart sounds. No murmur heard. Pulmonary:     Effort: Pulmonary effort is normal.     Breath sounds: Normal breath sounds. No wheezing, rhonchi or rales.  Abdominal:     General: Abdomen is flat. Bowel sounds are normal. There is no distension.     Palpations: Abdomen is soft.     Tenderness: There is no abdominal tenderness.  Musculoskeletal:        General: No swelling or deformity. Normal range of motion.     Cervical back: Normal range of motion.  Skin:    General: Skin is warm and dry.     Capillary Refill: Capillary refill takes less than 2 seconds.  Neurological:     General: No focal deficit present.     Mental Status: He is alert and oriented to person, place, and time.     Motor: No weakness.  Psychiatric:        Mood and Affect: Mood normal.        Behavior: Behavior normal.        Thought Content: Thought content normal.    Last CBC Lab Results  Component Value Date   WBC 9.2 07/27/2022   HGB 17.9 (H) 07/27/2022   HCT 53.7 (H) 07/27/2022   MCV 92 07/27/2022   MCH 30.5 07/27/2022   RDW 12.9 07/27/2022   PLT 320 07/27/2022   Last metabolic panel Lab  Results  Component Value Date   GLUCOSE 97 07/27/2022   NA 139 07/27/2022   K 4.2 07/27/2022   CL 98 07/27/2022   CO2 23 07/27/2022   BUN 13 07/27/2022   CREATININE 1.04 07/27/2022  EGFR 85 07/27/2022   CALCIUM  10.7 (H) 07/27/2022   PROT 7.5 07/27/2022   ALBUMIN 4.8 07/27/2022   LABGLOB 2.7 07/27/2022   AGRATIO 1.8 07/27/2022   BILITOT 0.5 07/27/2022   ALKPHOS 125 (H) 07/27/2022   AST 32 07/27/2022   ALT 54 (H) 07/27/2022   ANIONGAP 9 09/27/2018   Last lipids Lab Results  Component Value Date   CHOL 189 07/27/2022   HDL 33 (L) 07/27/2022   LDLCALC 108 (H) 07/27/2022   TRIG 275 (H) 07/27/2022   CHOLHDL 5.7 (H) 07/27/2022   Last hemoglobin A1c Lab Results  Component Value Date   HGBA1C 6.2 (H) 07/27/2022   Last thyroid  functions Lab Results  Component Value Date   TSH 2.420 07/27/2022   Last vitamin D  Lab Results  Component Value Date   VD25OH 54.6 07/27/2022   Last vitamin B12 and Folate Lab Results  Component Value Date   VITAMINB12 464 07/27/2022   FOLATE 2.0 (L) 07/27/2022       Assessment & Plan:    Routine Health Maintenance and Physical Exam  Immunization History  Administered Date(s) Administered   Influenza, Seasonal, Injecte, Preservative Fre 01/27/2023   Tdap 06/26/2019   Zoster Recombinant(Shingrix) 07/17/2020, 01/20/2021    Health Maintenance  Topic Date Due   COVID-19 Vaccine (3 - 2024-25 season) 11/22/2022   Medicare Annual Wellness (AWV)  09/29/2023   INFLUENZA VACCINE  10/22/2023   Lung Cancer Screening  10/23/2023   Fecal DNA (Cologuard)  09/29/2024   DTaP/Tdap/Td (2 - Td or Tdap) 06/25/2029   Hepatitis C Screening  Completed   HIV Screening  Completed   Zoster Vaccines- Shingrix  Completed   HPV VACCINES  Aged Out   Meningococcal B Vaccine  Aged Out    Discussed health benefits of physical activity, and encouraged him to engage in regular exercise appropriate for his age and condition.  Problem List Items Addressed This  Visit       Essential hypertension, benign   Remains adequately controlled on current antihypertensive regimen.  No medication changes are indicated today.      Hypothyroidism following radioiodine therapy   He is currently prescribed levothyroxine  137 mcg daily.  Repeat thyroid  studies ordered today.      Cigarette nicotine dependence with nicotine-induced disorder   He currently smokes 2 packs/day of cigarettes and remains precontemplative with regards to cessation.  CT chest for lung screening last completed in August 2024.  Lung RADS 3.  Repeat imaging recommended for 6 months.  He missed the follow-up CT chest.  Will assist with rescheduling today.      Pre-diabetes   A1c 6.2 on previous labs.  Metformin  was prescribed but he endorses intolerance.  He is not interested in injectable options.  Repeat A1c ordered today      Encounter for well adult exam with abnormal findings - Primary   Annual physical completed today.  Previous records and labs reviewed. -Repeat labs ordered today -Will assist with rescheduling follow-up CT chest -We will tentatively plan for follow-up in 6 months      Return in about 6 months (around 01/29/2024).  Tobi Fortes, MD

## 2023-07-30 ENCOUNTER — Other Ambulatory Visit: Payer: Self-pay | Admitting: Internal Medicine

## 2023-07-30 DIAGNOSIS — E782 Mixed hyperlipidemia: Secondary | ICD-10-CM

## 2023-07-30 LAB — CBC WITH DIFFERENTIAL/PLATELET
Basophils Absolute: 0.2 10*3/uL (ref 0.0–0.2)
Basos: 2 %
EOS (ABSOLUTE): 0.2 10*3/uL (ref 0.0–0.4)
Eos: 2 %
Hematocrit: 50.2 % (ref 37.5–51.0)
Hemoglobin: 17.4 g/dL (ref 13.0–17.7)
Immature Grans (Abs): 0.1 10*3/uL (ref 0.0–0.1)
Immature Granulocytes: 1 %
Lymphocytes Absolute: 2.5 10*3/uL (ref 0.7–3.1)
Lymphs: 26 %
MCH: 31.1 pg (ref 26.6–33.0)
MCHC: 34.7 g/dL (ref 31.5–35.7)
MCV: 90 fL (ref 79–97)
Monocytes Absolute: 0.6 10*3/uL (ref 0.1–0.9)
Monocytes: 7 %
Neutrophils Absolute: 5.9 10*3/uL (ref 1.4–7.0)
Neutrophils: 62 %
Platelets: 305 10*3/uL (ref 150–450)
RBC: 5.59 x10E6/uL (ref 4.14–5.80)
RDW: 13.1 % (ref 11.6–15.4)
WBC: 9.4 10*3/uL (ref 3.4–10.8)

## 2023-07-30 LAB — LIPID PANEL
Chol/HDL Ratio: 5.2 ratio — ABNORMAL HIGH (ref 0.0–5.0)
Cholesterol, Total: 165 mg/dL (ref 100–199)
HDL: 32 mg/dL — ABNORMAL LOW (ref >39)
LDL Chol Calc (NIH): 93 mg/dL (ref 0–99)
Triglycerides: 232 mg/dL — ABNORMAL HIGH (ref 0–149)
VLDL Cholesterol Cal: 40 mg/dL (ref 5–40)

## 2023-07-30 LAB — B12 AND FOLATE PANEL
Folate: 15.9 ng/mL (ref >3.0)
Vitamin B-12: 382 pg/mL (ref 232–1245)

## 2023-07-30 LAB — CMP14+EGFR
ALT: 53 IU/L — ABNORMAL HIGH (ref 0–44)
AST: 30 IU/L (ref 0–40)
Albumin: 4.5 g/dL (ref 3.8–4.9)
Alkaline Phosphatase: 121 IU/L (ref 44–121)
BUN/Creatinine Ratio: 8 — ABNORMAL LOW (ref 9–20)
BUN: 9 mg/dL (ref 6–24)
Bilirubin Total: 0.6 mg/dL (ref 0.0–1.2)
CO2: 23 mmol/L (ref 20–29)
Calcium: 10.1 mg/dL (ref 8.7–10.2)
Chloride: 97 mmol/L (ref 96–106)
Creatinine, Ser: 1.07 mg/dL (ref 0.76–1.27)
Globulin, Total: 2.8 g/dL (ref 1.5–4.5)
Glucose: 100 mg/dL — ABNORMAL HIGH (ref 70–99)
Potassium: 4.2 mmol/L (ref 3.5–5.2)
Sodium: 137 mmol/L (ref 134–144)
Total Protein: 7.3 g/dL (ref 6.0–8.5)
eGFR: 81 mL/min/{1.73_m2} (ref >59)

## 2023-07-30 LAB — PSA: Prostate Specific Ag, Serum: 1 ng/mL (ref 0.0–4.0)

## 2023-07-30 LAB — TSH+FREE T4
Free T4: 1.58 ng/dL (ref 0.82–1.77)
TSH: 1.89 u[IU]/mL (ref 0.450–4.500)

## 2023-07-30 LAB — VITAMIN D 25 HYDROXY (VIT D DEFICIENCY, FRACTURES): Vit D, 25-Hydroxy: 60.6 ng/mL (ref 30.0–100.0)

## 2023-07-30 LAB — HEMOGLOBIN A1C
Est. average glucose Bld gHb Est-mCnc: 151 mg/dL
Hgb A1c MFr Bld: 6.9 % — ABNORMAL HIGH (ref 4.8–5.6)

## 2023-07-30 MED ORDER — ATORVASTATIN CALCIUM 80 MG PO TABS: 80.0000 mg | ORAL_TABLET | Freq: Every day | ORAL | 3 refills | Status: DC

## 2023-07-30 NOTE — Telephone Encounter (Signed)
 F/U scan scheduled for 08/10/2023 at 1:30pm

## 2023-08-10 ENCOUNTER — Ambulatory Visit (HOSPITAL_COMMUNITY)
Admission: RE | Admit: 2023-08-10 | Discharge: 2023-08-10 | Disposition: A | Discharge status: Home / Self Care | Source: Ambulatory Visit | Attending: Acute Care | Admitting: Acute Care

## 2023-08-10 DIAGNOSIS — Z87891 Personal history of nicotine dependence: Secondary | ICD-10-CM | POA: Diagnosis not present

## 2023-08-10 DIAGNOSIS — I700 Atherosclerosis of aorta: Secondary | ICD-10-CM | POA: Diagnosis not present

## 2023-08-10 DIAGNOSIS — R911 Solitary pulmonary nodule: Secondary | ICD-10-CM | POA: Diagnosis not present

## 2023-08-10 DIAGNOSIS — J432 Centrilobular emphysema: Secondary | ICD-10-CM | POA: Diagnosis not present

## 2023-09-06 ENCOUNTER — Other Ambulatory Visit: Payer: Self-pay | Admitting: Acute Care

## 2023-09-06 DIAGNOSIS — F17210 Nicotine dependence, cigarettes, uncomplicated: Secondary | ICD-10-CM

## 2023-09-06 DIAGNOSIS — Z87891 Personal history of nicotine dependence: Secondary | ICD-10-CM

## 2023-09-06 DIAGNOSIS — Z122 Encounter for screening for malignant neoplasm of respiratory organs: Secondary | ICD-10-CM

## 2023-10-04 ENCOUNTER — Ambulatory Visit (INDEPENDENT_AMBULATORY_CARE_PROVIDER_SITE_OTHER): Payer: Medicare Other

## 2023-10-04 VITALS — Ht 71.0 in | Wt 243.0 lb

## 2023-10-04 DIAGNOSIS — Z0000 Encounter for general adult medical examination without abnormal findings: Secondary | ICD-10-CM | POA: Diagnosis not present

## 2023-10-04 NOTE — Progress Notes (Signed)
 Subjective:   Donald Chambers is a 58 y.o. who presents for a Medicare Wellness preventive visit.  As a reminder, Annual Wellness Visits don't include a physical exam, and some assessments may be limited, especially if this visit is performed virtually. We may recommend an in-person follow-up visit with your provider if needed.  Visit Complete: Virtual I connected with  Aeric R Selk on 10/04/23 by a audio enabled telemedicine application and verified that I am speaking with the correct person using two identifiers.  Patient Location: Home  Provider Location: Home Office  I discussed the limitations of evaluation and management by telemedicine. The patient expressed understanding and agreed to proceed.  Vital Signs: Because this visit was a virtual/telehealth visit, some criteria may be missing or patient reported. Any vitals not documented were not able to be obtained and vitals that have been documented are patient reported.  VideoDeclined- This patient declined Librarian, academic. Therefore the visit was completed with audio only.  Persons Participating in Visit: Patient.  AWV Questionnaire: No: Patient Medicare AWV questionnaire was not completed prior to this visit.  Cardiac Risk Factors include: advanced age (>44men, >62 women);dyslipidemia;hypertension;obesity (BMI >30kg/m2)     Objective:    Today's Vitals   10/04/23 1059  Weight: 243 lb (110.2 kg)  Height: 5' 11 (1.803 m)   Body mass index is 33.89 kg/m.     10/04/2023   11:02 AM 09/29/2022   11:27 AM 03/07/2017   12:37 PM 07/01/2016   11:29 AM  Advanced Directives  Does Patient Have a Medical Advance Directive? Yes Yes No  No   Type of Estate agent of [place];Living will Healthcare Power of [place];Living will    Does patient want to make changes to medical advance directive? No - Patient declined No - Patient declined    Copy of Healthcare Power of  Attorney in Chart? Yes - validated most recent copy scanned in chart (See row information) Yes - validated most recent copy scanned in chart (See row information)       Data saved with a previous flowsheet row definition    Current Medications (verified) Outpatient Encounter Medications as of 10/04/2023  Medication Sig   atorvastatin  (LIPITOR) 80 MG tablet Take 1 tablet (80 mg total) by mouth daily.   cetirizine  (ZYRTEC ) 10 MG tablet Take 1 tablet (10 mg total) by mouth daily.   Chlorphen-PE-Acetaminophen (NOREL AD) 4-10-325 MG TABS Take 1 tablet by mouth 3 (three) times daily as needed.   Cholecalciferol (VITAMIN D3) 125 MCG (5000 UT) CAPS Take 1 capsule (5,000 Units total) by mouth daily.   levothyroxine  (SYNTHROID ) 137 MCG tablet TAKE 1 Tablet BY MOUTH ONCE DAILY BEFORE BREAKFAST   lisinopril -hydrochlorothiazide  (ZESTORETIC ) 20-25 MG tablet Take 1 tablet by mouth daily.   loratadine  (CLARITIN ) 10 MG tablet Take 1 tablet (10 mg total) by mouth daily.   Omega-3 Fatty Acids (FISH OIL ) 1000 MG CAPS Take 2 capsules (2,000 mg total) by mouth daily.   omeprazole  (PRILOSEC) 40 MG capsule Take 1 capsule by mouth once daily   promethazine -dextromethorphan (PROMETHAZINE -DM) 6.25-15 MG/5ML syrup Take 5 mLs by mouth 4 (four) times daily as needed for cough.   No facility-administered encounter medications on file as of 10/04/2023.    Allergies (verified) Patient has no known allergies.   History: Past Medical History:  Diagnosis Date   GERD (gastroesophageal reflux disease)    Hypercholesteremia    Hypertension    Hyperthyroidism    s/p  RAI   History reviewed. No pertinent surgical history. Family History  Problem Relation Age of Onset   Diabetes Father    Hypertension Father    Hypertension Mother    Social History   Socioeconomic History   Marital status: Single    Spouse name: Not on file   Number of children: Not on file   Years of education: Not on file   Highest education  level: Not on file  Occupational History   Not on file  Tobacco Use   Smoking status: Former    Current packs/day: 2.00    Average packs/day: 2.0 packs/day for 40.0 years (80.0 ttl pk-yrs)    Types: Cigarettes   Smokeless tobacco: Never   Tobacco comments:    2 packs daily x 40 years. Updated for lung cancer screening qualification   Vaping Use   Vaping status: Never Used  Substance and Sexual Activity   Alcohol use: No    Alcohol/week: 0.0 standard drinks of alcohol   Drug use: No   Sexual activity: Not on file  Other Topics Concern   Not on file  Social History Narrative   Not on file   Social Drivers of Health   Financial Resource Strain: Low Risk  (10/04/2023)   Overall Financial Resource Strain (CARDIA)    Difficulty of Paying Living Expenses: Not hard at all  Food Insecurity: No Food Insecurity (10/04/2023)   Hunger Vital Sign    Worried About Running Out of Food in the Last Year: Never true    Ran Out of Food in the Last Year: Never true  Transportation Needs: No Transportation Needs (10/04/2023)   PRAPARE - Administrator, Civil Service (Medical): No    Lack of Transportation (Non-Medical): No  Physical Activity: Inactive (10/04/2023)   Exercise Vital Sign    Days of Exercise per Week: 0 days    Minutes of Exercise per Session: 0 min  Stress: No Stress Concern Present (10/04/2023)   [employer] of Occupational Health - Occupational Stress Questionnaire    Feeling of Stress: Not at all  Social Connections: Socially Integrated (10/04/2023)   Social Connection and Isolation Panel    Frequency of Communication with Friends and Family: More than three times a week    Frequency of Social Gatherings with Friends and Family: More than three times a week    Attends Religious Services: More than 4 times per year    Active Member of [employer] or Organizations: Yes    Attends Engineer, structural: More than 4 times per year    Marital Status: Married     Tobacco Counseling Counseling given: Yes Tobacco comments: 2 packs daily x 40 years. Updated for lung cancer screening qualification     Clinical Intake:  Pre-visit preparation completed: Yes  Pain : No/denies pain     BMI - recorded: 33.89 Nutritional Status: BMI > 30  Obese Nutritional Risks: None Diabetes: No (per patient borderline diabetic)  Lab Results  Component Value Date   HGBA1C 6.9 (H) 07/29/2023   HGBA1C 6.2 (H) 07/27/2022   HGBA1C 6.2 (H) 03/30/2018     How often do you need to have someone help you when you read instructions, pamphlets, or other written materials from your doctor or pharmacy?: 5 - Always  Interpreter Needed?: No  Information entered by :: A Deshana Rominger, CMA-AAMA   Activities of Daily Living     10/04/2023   11:02 AM  In your present state of  health, do you have any difficulty performing the following activities:  Hearing? 0  Vision? 0  Difficulty concentrating or making decisions? 0  Walking or climbing stairs? 0  Dressing or bathing? 0  Doing errands, shopping? 0  Preparing Food and eating ? N  Using the Toilet? N  In the past six months, have you accidently leaked urine? N  Do you have problems with loss of bowel control? N  Managing your Medications? N  Managing your Finances? N  Housekeeping or managing your Housekeeping? N    Patient Care Team: Bevely Doffing, FNP as PCP - General (Family Medicine)  I have updated your Care Teams any recent Medical Services you may have received from other providers in the past year.     Assessment:   This is a routine wellness examination for Jedd.  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 Currently has no eye doctor. Will look into a referral for [facility] in [place]    Goals Addressed               This Visit's Progress     Patient Stated (pt-stated)   On track     Patient would like to lose weight       Depression Screen      10/04/2023   11:53 AM 07/29/2023    2:41 PM 01/27/2023    1:14 PM 09/29/2022   11:24 AM 07/27/2022    9:21 AM 08/27/2016    9:23 AM 05/27/2016    8:45 AM  PHQ 2/9 Scores  PHQ - 2 Score 0 0 0 0 0 0 0  PHQ- 9 Score 0 0   0      Fall Risk     10/04/2023   11:23 AM 01/27/2023    1:14 PM 09/29/2022   11:27 AM 07/27/2022    9:21 AM 08/27/2016    9:23 AM  Fall Risk   Falls in the past year? 0 0 0 0 No   Number falls in past yr: 0 0 0 0   Injury with Fall? 0 0 0 0   Risk for fall due to : No Fall Risks No Fall Risks No Fall Risks No Fall Risks   Follow up Falls evaluation completed;Education provided;Falls prevention discussed Falls evaluation completed Falls prevention discussed Falls evaluation completed      Data saved with a previous flowsheet row definition    MEDICARE RISK AT HOME:  Medicare Risk at Home Any stairs in or around the home?: No If so, are there any without handrails?: No Home free of loose throw rugs in walkways, pet beds, electrical cords, etc?: Yes Adequate lighting in your home to reduce risk of falls?: Yes Life alert?: No Use of a cane, walker or w/c?: No Grab bars in the bathroom?: Yes Shower chair or bench in shower?: No Elevated toilet seat or a handicapped toilet?: No  TIMED UP AND GO:  Was the test performed?  No  Cognitive Function: 6CIT completed        10/04/2023   11:52 AM 09/29/2022   11:27 AM  6CIT Screen  What Year? 0 points 0 points  What month? 0 points 0 points  What time? 0 points 0 points  Count back from 20 0 points 0 points  Months in reverse 0 points 0 points  Repeat phrase 0 points 0 points  Total Score 0 points 0 points    Immunizations Immunization History  Administered Date(s)  Administered   Influenza, Seasonal, Injecte, Preservative Fre 01/27/2023   Tdap 06/26/2019   Zoster Recombinant(Shingrix) 07/17/2020, 01/20/2021    Screening Tests Health Maintenance  Topic Date Due   Pneumococcal Vaccine 60-15 Years old (1 of 2 -  PCV) Never done   Hepatitis B Vaccines (1 of 3 - 19+ 3-dose series) Never done   COVID-19 Vaccine (1 - 2024-25 season) Never done   INFLUENZA VACCINE  10/22/2023   Lung Cancer Screening  08/09/2024   Fecal DNA (Cologuard)  09/29/2024   Medicare Annual Wellness (AWV)  10/03/2024   DTaP/Tdap/Td (2 - Td or Tdap) 06/25/2029   Hepatitis C Screening  Completed   HIV Screening  Completed   Zoster Vaccines- Shingrix  Completed   HPV VACCINES  Aged Out   Meningococcal B Vaccine  Aged Out    Health Maintenance  Health Maintenance Due  Topic Date Due   Pneumococcal Vaccine 65-67 Years old (1 of 2 - PCV) Never done   Hepatitis B Vaccines (1 of 3 - 19+ 3-dose series) Never done   COVID-19 Vaccine (1 - 2024-25 season) Never done   Health Maintenance Items Addressed: Patient advised of recommended vaccines and where to obtain those vaccines with verbal understanding  Additional Screening:  Vision Screening: Recommended annual ophthalmology exams for early detection of glaucoma and other disorders of the eye. Would you like a referral to an eye doctor? No    Dental Screening: Recommended annual dental exams for proper oral hygiene  Community Resource Referral / Chronic Care Management: CRR required this visit?  No   CCM required this visit?  No   Plan:    I have personally reviewed and noted the following in the patient's chart:   Medical and social history Use of alcohol, tobacco or illicit drugs  Current medications and supplements including opioid prescriptions. Patient is not currently taking opioid prescriptions. Functional ability and status Nutritional status Physical activity Advanced directives List of other physicians Hospitalizations, surgeries, and ER visits in previous 12 months Vitals Screenings to include cognitive, depression, and falls Referrals and appointments  In addition, I have reviewed and discussed with patient certain preventive protocols, quality  metrics, and best practice recommendations. A written personalized care plan for preventive services as well as general preventive health recommendations were provided to patient.   Paytience Bures, CMA   10/04/2023   After Visit Summary: (Mail) Due to this being a telephonic visit, the after visit summary with patients personalized plan was offered to patient via mail   Notes: Nothing significant to report at this time.

## 2023-10-04 NOTE — Patient Instructions (Signed)
 [name] ,  Thank you for taking time out of your busy schedule to complete your Annual Wellness Visit with me. I enjoyed our conversation and look forward to speaking with you again next year. I, as well as your care team,  appreciate your ongoing commitment to your health goals. Please review the following plan we discussed and let me know if I can assist you in the future.  I enjoyed our conversation and look forward to it again next year. Blessing for the upcoming year!!  -Mistina Coatney  Your Game plan/ To Do List   Referrals Placed:  N/a  Follow up Visits:  Next Office Visit with your Primary Care Provider: February 01, 2024 at 1:20pm  Medicare Wellness with Health Advisor (1 year): October 04, 2024    Clinician Recommendations:  Aim for 30 minutes of exercise or brisk walking, 6-8 glasses of water, and 5 servings of fruits and vegetables each day.       This is a list of the screening recommended for you and due dates:  Health Maintenance  Topic Date Due   Pneumococcal Vaccination (1 of 2 - PCV) Never done   Hepatitis B Vaccine (1 of 3 - 19+ 3-dose series) Never done   COVID-19 Vaccine (1 - 2024-25 season) Never done   Flu Shot  10/22/2023   Screening for Lung Cancer  08/09/2024   Cologuard (Stool DNA test)  09/29/2024   Medicare Annual Wellness Visit  10/03/2024   DTaP/Tdap/Td vaccine (2 - Td or Tdap) 06/25/2029   Hepatitis C Screening  Completed   HIV Screening  Completed   Zoster (Shingles) Vaccine  Completed   HPV Vaccine  Aged Out   Meningitis B Vaccine  Aged Out    Advanced directives: (In Chart) A copy of your advanced directives are scanned into your chart should your provider ever need it. Advance Care Planning is important because it:  [x]  Makes sure you receive the medical care that is consistent with your values, goals, and preferences  [x]  It provides guidance to your family and loved ones and reduces their decisional burden about whether or not they are making  the right decisions based on your wishes.  Follow the link provided in your after visit summary or read over the paperwork we have mailed to you to help you started getting your Advance Directives in place. If you need assistance in completing these, please reach out to us  so that we can help you!  If you choose to send your directives in yourself, the information to do so is below:  Please email a copy of your Advanced Healthcare Directives,such as your Healthcare Power of Attorney, Living Will or DNR status to the following secure email: [email or web address]

## 2023-10-25 ENCOUNTER — Other Ambulatory Visit: Payer: Self-pay | Admitting: Internal Medicine

## 2024-01-13 ENCOUNTER — Ambulatory Visit
Admission: RE | Admit: 2024-01-13 | Discharge: 2024-01-13 | Disposition: A | Discharge status: Home / Self Care | Attending: Family Medicine | Admitting: Family Medicine

## 2024-01-13 VITALS — BP 117/79 | HR 88 | Temp 98.6°F | Resp 18

## 2024-01-13 DIAGNOSIS — B9789 Other viral agents as the cause of diseases classified elsewhere: Secondary | ICD-10-CM

## 2024-01-13 DIAGNOSIS — J329 Chronic sinusitis, unspecified: Secondary | ICD-10-CM

## 2024-01-13 MED ORDER — BENZONATATE 200 MG PO CAPS: 200.0000 mg | ORAL_CAPSULE | Freq: Three times a day (TID) | ORAL | 0 refills | Status: AC | PRN

## 2024-01-13 MED ORDER — PREDNISONE 20 MG PO TABS: 40.0000 mg | ORAL_TABLET | Freq: Every day | ORAL | 0 refills | Status: DC

## 2024-01-13 NOTE — ED Provider Notes (Signed)
 [facility]    CSN: [id number] Arrival date & time: 01/13/24  1424      History   Chief Complaint No chief complaint on file.   HPI Donald Chambers is a 58 y.o. male.   Patient presenting today with 4-day history of sinus pressure, congestion, right sided facial pain, sinus headache.  Denies fever, chills, chest pain, shortness of breath, abdominal pain, vomiting, diarrhea.  So far trying allergy medication, Coricidin HBP with minimal relief.    Past Medical History:  Diagnosis Date   GERD (gastroesophageal reflux disease)    Hypercholesteremia    Hypertension    Hyperthyroidism    s/p RAI    Patient Active Problem List   Diagnosis Date Noted   Chronic rhinitis 01/27/2023   Folate deficiency 01/27/2023   At risk for obstructive sleep apnea 01/27/2023   Need for influenza vaccination 01/27/2023   Encounter for well adult exam with abnormal findings 07/27/2022   Gastroesophageal reflux disease 03/03/2017   Cigarette nicotine dependence with nicotine-induced disorder 03/03/2017   Obesity 03/03/2017   Unable to read or write 03/03/2017   Hypothyroidism following radioiodine therapy 05/27/2015   Vitamin D  deficiency 05/27/2015   Hyperlipidemia 05/27/2015   Essential hypertension, benign 05/27/2015   Pre-diabetes 05/27/2015    History reviewed. No pertinent surgical history.     Home Medications    Prior to Admission medications   Medication Sig Start Date End Date Taking? Authorizing Provider  benzonatate  (TESSALON ) 200 MG capsule Take 1 capsule (200 mg total) by mouth 3 (three) times daily as needed for cough. 01/13/24  Yes Stuart Vernell Norris, PA-C  predniSONE  (DELTASONE ) 20 MG tablet Take 2 tablets (40 mg total) by mouth daily with breakfast. 01/13/24  Yes Stuart Vernell Norris, PA-C  atorvastatin  (LIPITOR) 80 MG tablet Take 1 tablet (80 mg total) by mouth daily. 07/30/23   Melvenia Manus BRAVO, MD  cetirizine  (ZYRTEC ) 10 MG tablet Take 1 tablet  (10 mg total) by mouth daily. 07/28/22   Melvenia Manus BRAVO, MD  Chlorphen-PE-Acetaminophen (NOREL AD) 4-10-325 MG TABS Take 1 tablet by mouth 3 (three) times daily as needed. 12/07/22   Tobie Suzzane POUR, MD  Cholecalciferol (VITAMIN D3) 125 MCG (5000 UT) CAPS Take 1 capsule (5,000 Units total) by mouth daily. 07/28/22   Melvenia Manus BRAVO, MD  levothyroxine  (SYNTHROID ) 137 MCG tablet TAKE 1 Tablet BY MOUTH ONCE DAILY BEFORE BREAKFAST 07/28/22   Melvenia Manus BRAVO, MD  lisinopril -hydrochlorothiazide  (ZESTORETIC ) 20-25 MG tablet Take 1 tablet by mouth daily. 02/25/23   Melvenia Manus BRAVO, MD  loratadine  (CLARITIN ) 10 MG tablet Take 1 tablet (10 mg total) by mouth daily. 07/28/22   Melvenia Manus BRAVO, MD  Omega-3 Fatty Acids (FISH OIL ) 1000 MG CAPS Take 2 capsules (2,000 mg total) by mouth daily. 07/28/22   Melvenia Manus BRAVO, MD  omeprazole  (PRILOSEC) 40 MG capsule Take 1 capsule by mouth once daily 10/25/23   Bevely Doffing, FNP  promethazine -dextromethorphan (PROMETHAZINE -DM) 6.25-15 MG/5ML syrup Take 5 mLs by mouth 4 (four) times daily as needed for cough. 12/07/22   Tobie Suzzane POUR, MD    Family History Family History  Problem Relation Age of Onset   Diabetes Father    Hypertension Father    Hypertension Mother     Social History Social History   Tobacco Use   Smoking status: Former    Current packs/day: 2.00    Average packs/day: 2.0 packs/day for 40.0 years (80.0 ttl pk-yrs)    Types: Cigarettes  Smokeless tobacco: Never   Tobacco comments:    2 packs daily x 40 years. Updated for lung cancer screening qualification   Vaping Use   Vaping status: Never Used  Substance Use Topics   Alcohol use: No    Alcohol/week: 0.0 standard drinks of alcohol   Drug use: No     Allergies   Patient has no known allergies.   Review of Systems Review of Systems PER HPI  Physical Exam Triage Vital Signs ED Triage Vitals  Encounter Vitals Group     BP 01/13/24 1433 117/79     Girls Systolic BP Percentile --       Girls Diastolic BP Percentile --      Boys Systolic BP Percentile --      Boys Diastolic BP Percentile --      Pulse Rate 01/13/24 1433 88     Resp 01/13/24 1433 18     Temp 01/13/24 1433 98.6 F (37 C)     Temp Source 01/13/24 1433 Oral     SpO2 01/13/24 1433 94 %     Weight --      Height --      Head Circumference --      Peak Flow --      Pain Score 01/13/24 1436 10     Pain Loc --      Pain Education --      Exclude from Growth Chart --    No data found.  Updated Vital Signs BP 117/79 (BP Location: Right Arm)   Pulse 88   Temp 98.6 F (37 C) (Oral)   Resp 18   SpO2 94%   Visual Acuity Right Eye Distance:   Left Eye Distance:   Bilateral Distance:    Right Eye Near:   Left Eye Near:    Bilateral Near:     Physical Exam Vitals and nursing note reviewed.  Constitutional:      Appearance: He is well-developed.  HENT:     Head: Atraumatic.     Right Ear: Tympanic membrane and external ear normal.     Left Ear: Tympanic membrane and external ear normal.     Nose: Rhinorrhea present.     Mouth/Throat:     Pharynx: Posterior oropharyngeal erythema present. No oropharyngeal exudate.  Eyes:     Conjunctiva/sclera: Conjunctivae normal.     Pupils: Pupils are equal, round, and reactive to light.  Cardiovascular:     Rate and Rhythm: Normal rate and regular rhythm.  Pulmonary:     Effort: Pulmonary effort is normal. No respiratory distress.     Breath sounds: No wheezing or rales.  Musculoskeletal:        General: Normal range of motion.     Cervical back: Normal range of motion and neck supple.  Lymphadenopathy:     Cervical: No cervical adenopathy.  Skin:    General: Skin is warm and dry.  Neurological:     Mental Status: He is alert and oriented to person, place, and time.  Psychiatric:        Behavior: Behavior normal.      UC Treatments / Results  Labs (all labs ordered are listed, but only abnormal results are displayed) Labs Reviewed - No  data to display  EKG   Radiology No results found.  Procedures Procedures (including critical care time)  Medications Ordered in UC Medications - No data to display  Initial Impression / Assessment and Plan / UC Course  I have reviewed the triage vital signs and the nursing notes.  Pertinent labs & imaging results that were available during my care of the patient were reviewed by me and considered in my medical decision making (see chart for details).     Vital signs and exam reassuring, suspect viral sinusitis.  Will treat with prednisone , Tessalon , supportive over-the-counter medications and home care.  Return for worsening or unresolving symptoms.  Final Clinical Impressions(s) / UC Diagnoses   Final diagnoses:  Viral sinusitis     Discharge Instructions      In addition to the prescribed medications, take the Zyrtec  daily, Astelin and/or Flonase nasal spray twice daily, use warm saline sinus rinses several times daily, over-the-counter pain relievers as needed, Coricidin HBP.    ED Prescriptions     Medication Sig Dispense Auth. Provider   predniSONE  (DELTASONE ) 20 MG tablet Take 2 tablets (40 mg total) by mouth daily with breakfast. 10 tablet Stuart Vernell Norris, PA-C   benzonatate  (TESSALON ) 200 MG capsule Take 1 capsule (200 mg total) by mouth 3 (three) times daily as needed for cough. 20 capsule Stuart Vernell Norris, [place]      PDMP not reviewed this encounter.   Stuart Vernell Norris, [place] 01/13/24 1511

## 2024-01-13 NOTE — ED Triage Notes (Signed)
 Pt reports, sinus pressure,  right side facial pain and headache, x 4 days, otc meds have not helped with sx's.

## 2024-01-13 NOTE — Discharge Instructions (Signed)
 In addition to the prescribed medications, take the Zyrtec  daily, Astelin and/or Flonase nasal spray twice daily, use warm saline sinus rinses several times daily, over-the-counter pain relievers as needed, Coricidin HBP.

## 2024-01-20 ENCOUNTER — Other Ambulatory Visit: Payer: Self-pay

## 2024-02-01 ENCOUNTER — Ambulatory Visit

## 2024-02-01 VITALS — BP 118/81 | HR 81 | Ht 71.0 in | Wt 241.0 lb

## 2024-02-01 DIAGNOSIS — E890 Postprocedural hypothyroidism: Secondary | ICD-10-CM

## 2024-02-01 DIAGNOSIS — I10 Essential (primary) hypertension: Secondary | ICD-10-CM

## 2024-02-01 DIAGNOSIS — Z23 Encounter for immunization: Secondary | ICD-10-CM | POA: Diagnosis not present

## 2024-02-01 DIAGNOSIS — K219 Gastro-esophageal reflux disease without esophagitis: Secondary | ICD-10-CM

## 2024-02-01 DIAGNOSIS — E559 Vitamin D deficiency, unspecified: Secondary | ICD-10-CM | POA: Diagnosis not present

## 2024-02-01 DIAGNOSIS — Z6835 Body mass index (BMI) 35.0-35.9, adult: Secondary | ICD-10-CM

## 2024-02-01 DIAGNOSIS — R7303 Prediabetes: Secondary | ICD-10-CM

## 2024-02-01 DIAGNOSIS — E66812 Obesity, class 2: Secondary | ICD-10-CM

## 2024-02-01 DIAGNOSIS — E782 Mixed hyperlipidemia: Secondary | ICD-10-CM

## 2024-02-01 MED ORDER — ATORVASTATIN CALCIUM 80 MG PO TABS: 80.0000 mg | ORAL_TABLET | Freq: Every day | ORAL | 3 refills | Status: AC

## 2024-02-01 MED ORDER — OMEPRAZOLE 40 MG PO CPDR: 40.0000 mg | DELAYED_RELEASE_CAPSULE | Freq: Every day | ORAL | 3 refills | Status: AC

## 2024-02-01 MED ORDER — LEVOTHYROXINE SODIUM 137 MCG PO TABS
137.0000 ug | ORAL_TABLET | Freq: Every day | ORAL | 3 refills | Status: AC
Start: 2024-02-01 — End: ?

## 2024-02-01 MED ORDER — LISINOPRIL-HYDROCHLOROTHIAZIDE 20-25 MG PO TABS: 1.0000 | ORAL_TABLET | Freq: Every day | ORAL | 3 refills | Status: AC

## 2024-02-01 NOTE — Progress Notes (Unsigned)
 Established Patient Office Visit  Subjective   Patient ID: Donald Chambers, male    DOB: Feb 04, 1966  Age: 58 y.o. MRN: [id number]  Chief Complaint  Patient presents with   Medical Management of Chronic Issues    6 month  follow up    HPI Discussed the use of AI scribe software for clinical note transcription with the patient, who gave verbal consent to proceed.  History of Present Illness    Donald Chambers is a 58 year old male who presents for medication refills and evaluation of intermittent leg swelling.  Unilateral lower extremity edema - Intermittent swelling localized to the right leg, particularly around the ankle, present for the past 2-3 months - Swelling resolves overnight and is more pronounced with increased walking - No extension of swelling across the top of the foot or up the leg - No swelling in the left leg - History of multiple ankle twists, no prior surgeries - Onset of swelling associated with resolution of a prior episode of heel pain that lasted about a month and resolved over two months ago  Medication management - Currently taking levothyroxine  for thyroid  management, 'Astrovasculin' for cholesterol, and antihypertensive medication - Requires refills for all current prescriptions - Prescriptions now managed under current provider's name  Dietary modification and weight loss - Actively reducing carbohydrate intake, including bread, potatoes, and pasta - Occasional consumption of fried foods - Focusing on increased intake of meat and vegetables - Weight loss of approximately fifteen pounds since last visit  Resolved sinus infection - Previous sinus infection treated with steroids, resulting in improvement over 10-14 days - Significant facial pain and swelling during infection, now resolved  Other symptoms - No current stomach issues - No current heel pain     Patient Active Problem List   Diagnosis Date Noted   Chronic rhinitis 01/27/2023    Folate deficiency 01/27/2023   At risk for obstructive sleep apnea 01/27/2023   Need for influenza vaccination 01/27/2023   Encounter for well adult exam with abnormal findings 07/27/2022   Gastroesophageal reflux disease 03/03/2017   Cigarette nicotine dependence with nicotine-induced disorder 03/03/2017   Obesity 03/03/2017   Unable to read or write 03/03/2017   Hypothyroidism following radioiodine therapy 05/27/2015   Vitamin D  deficiency 05/27/2015   Hyperlipidemia 05/27/2015   Essential hypertension, benign 05/27/2015   Pre-diabetes 05/27/2015      ROS    Objective:     BP 118/81   Pulse 81   Ht 5' 11 (1.803 m)   Wt 241 lb 0.6 oz (109.3 kg)   SpO2 95%   BMI 33.62 kg/m  BP Readings from Last 3 Encounters:  02/01/24 118/81  01/13/24 117/79  07/29/23 132/84   Wt Readings from Last 3 Encounters:  02/01/24 241 lb 0.6 oz (109.3 kg)  10/04/23 243 lb (110.2 kg)  07/29/23 254 lb (115.2 kg)     Physical Exam Vitals and nursing note reviewed.  Constitutional:      Appearance: Normal appearance. He is obese.  HENT:     Head: Normocephalic.  Eyes:     Extraocular Movements: Extraocular movements intact.     Pupils: Pupils are equal, round, and reactive to light.  Cardiovascular:     Rate and Rhythm: Normal rate and regular rhythm.  Pulmonary:     Effort: Pulmonary effort is normal.     Breath sounds: Normal breath sounds.  Musculoskeletal:     Cervical back: Normal range of motion and neck  supple.  Neurological:     Mental Status: He is alert and oriented to person, place, and time.  Psychiatric:        Mood and Affect: Mood normal.        Thought Content: Thought content normal.          The 10-year ASCVD risk score (Arnett DK, et al., 2019) is: 8.5%    Assessment & Plan:   Problem List Items Addressed This Visit       Cardiovascular and Mediastinum   Essential hypertension, benign - Primary   Remains adequately controlled on current  antihypertensive regimen.  No medication changes are indicated today.      Relevant Medications   atorvastatin  (LIPITOR) 80 MG tablet   lisinopril -hydrochlorothiazide  (ZESTORETIC ) 20-25 MG tablet   Other Relevant Orders   CMP14+EGFR (Completed)     Digestive   Gastroesophageal reflux disease   Symptoms are currently well-controlled with omeprazole  40 mg daily. -No medication changes today      Relevant Medications   omeprazole  (PRILOSEC) 40 MG capsule     Endocrine   Hypothyroidism following radioiodine therapy   He is currently prescribed levothyroxine  137 mcg daily.  Repeat thyroid  studies ordered today.      Relevant Medications   levothyroxine  (SYNTHROID ) 137 MCG tablet     Other   Vitamin D  deficiency   Previously documented history of vitamin D  deficiency.  He is currently on vitamin D  supplementation. -Repeat vitamin D  level ordered today      Hyperlipidemia    He is currently prescribed atorvastatin  20 mg daily and fish oil  supplementation. -Increase atorvastatin  to 40 mg daily      Relevant Medications   atorvastatin  (LIPITOR) 80 MG tablet   lisinopril -hydrochlorothiazide  (ZESTORETIC ) 20-25 MG tablet   Other Relevant Orders   Lipid panel (Completed)   Pre-diabetes   Repeat A1c ordered today      Relevant Orders   Hemoglobin A1c (Completed)   Obesity   Weight loss of 15 pounds achieved through dietary changes. Continued weight loss is desired. - Continue current dietary modifications to promote further weight loss.      Relevant Orders   VITAMIN D  25 Hydroxy (Vit-D Deficiency, Fractures) (Completed)   TSH + free T4 (Completed)   Hemoglobin A1c (Completed)   Lipid panel (Completed)   CMP14+EGFR (Completed)   Other Visit Diagnoses       Encounter for immunization       Relevant Orders   Flu vaccine trivalent PF, 6mos and older(Flulaval,Afluria,Fluarix,Fluzone) (Completed)      No follow-ups on file.    Leita Longs, FNP

## 2024-02-02 LAB — CMP14+EGFR
ALT: 51 IU/L — ABNORMAL HIGH (ref 0–44)
AST: 28 IU/L (ref 0–40)
Albumin: 4.6 g/dL (ref 3.8–4.9)
Alkaline Phosphatase: 143 IU/L — ABNORMAL HIGH (ref 47–123)
BUN/Creatinine Ratio: 11 (ref 9–20)
BUN: 11 mg/dL (ref 6–24)
Bilirubin Total: 0.6 mg/dL (ref 0.0–1.2)
CO2: 23 mmol/L (ref 20–29)
Calcium: 10.5 mg/dL — ABNORMAL HIGH (ref 8.7–10.2)
Chloride: 97 mmol/L (ref 96–106)
Creatinine, Ser: 1.03 mg/dL (ref 0.76–1.27)
Globulin, Total: 2.5 g/dL (ref 1.5–4.5)
Glucose: 92 mg/dL (ref 70–99)
Potassium: 4.3 mmol/L (ref 3.5–5.2)
Sodium: 138 mmol/L (ref 134–144)
Total Protein: 7.1 g/dL (ref 6.0–8.5)
eGFR: 85 mL/min/1.73 (ref >59)

## 2024-02-02 LAB — LIPID PANEL
Chol/HDL Ratio: 5.7 ratio — ABNORMAL HIGH (ref 0.0–5.0)
Cholesterol, Total: 164 mg/dL (ref 100–199)
HDL: 29 mg/dL — ABNORMAL LOW (ref >39)
LDL Chol Calc (NIH): 96 mg/dL (ref 0–99)
Triglycerides: 228 mg/dL — ABNORMAL HIGH (ref 0–149)
VLDL Cholesterol Cal: 39 mg/dL (ref 5–40)

## 2024-02-02 LAB — HEMOGLOBIN A1C
Est. average glucose Bld gHb Est-mCnc: 157 mg/dL
Hgb A1c MFr Bld: 7.1 % — ABNORMAL HIGH (ref 4.8–5.6)

## 2024-02-02 LAB — TSH+FREE T4
Free T4: 1.63 ng/dL (ref 0.82–1.77)
TSH: 2.28 u[IU]/mL (ref 0.450–4.500)

## 2024-02-02 LAB — VITAMIN D 25 HYDROXY (VIT D DEFICIENCY, FRACTURES): Vit D, 25-Hydroxy: 59.7 ng/mL (ref 30.0–100.0)

## 2024-02-03 ENCOUNTER — Ambulatory Visit: Payer: Self-pay

## 2024-02-03 NOTE — Assessment & Plan Note (Signed)
Repeat A1c ordered today 

## 2024-02-03 NOTE — Assessment & Plan Note (Signed)
 Weight loss of 15 pounds achieved through dietary changes. Continued weight loss is desired. - Continue current dietary modifications to promote further weight loss.

## 2024-02-03 NOTE — Assessment & Plan Note (Signed)
 He is currently prescribed atorvastatin  20 mg daily and fish oil  supplementation. -Increase atorvastatin  to 40 mg daily

## 2024-02-03 NOTE — Assessment & Plan Note (Signed)
Symptoms are currently well-controlled with omeprazole 40 mg daily. -No medication changes today

## 2024-02-03 NOTE — Assessment & Plan Note (Signed)
 He is currently prescribed levothyroxine  137 mcg daily.  Repeat thyroid  studies ordered today.

## 2024-02-03 NOTE — Assessment & Plan Note (Signed)
 Remains adequately controlled on current antihypertensive regimen.  No medication changes are indicated today.

## 2024-02-03 NOTE — Assessment & Plan Note (Signed)
Previously documented history of vitamin D deficiency.  He is currently on vitamin D supplementation. -Repeat vitamin D level ordered today

## 2024-10-04 ENCOUNTER — Ambulatory Visit
